# Patient Record
Sex: Male | Born: 1970 | Race: White | Hispanic: No | Marital: Married | Smoking: Never smoker
Health system: Southern US, Community
[De-identification: ages and names within clinical notes are randomized; demographics above are authoritative.]

## PROBLEM LIST (undated history)

## (undated) DIAGNOSIS — R7989 Other specified abnormal findings of blood chemistry: Secondary | ICD-10-CM

## (undated) DIAGNOSIS — J189 Pneumonia, unspecified organism: Secondary | ICD-10-CM

## (undated) DIAGNOSIS — R011 Cardiac murmur, unspecified: Secondary | ICD-10-CM

## (undated) DIAGNOSIS — E291 Testicular hypofunction: Secondary | ICD-10-CM

## (undated) DIAGNOSIS — Z9889 Other specified postprocedural states: Secondary | ICD-10-CM

## (undated) DIAGNOSIS — K4020 Bilateral inguinal hernia, without obstruction or gangrene, not specified as recurrent: Secondary | ICD-10-CM

## (undated) DIAGNOSIS — I350 Nonrheumatic aortic (valve) stenosis: Secondary | ICD-10-CM

## (undated) DIAGNOSIS — Z8619 Personal history of other infectious and parasitic diseases: Secondary | ICD-10-CM

## (undated) DIAGNOSIS — I10 Essential (primary) hypertension: Secondary | ICD-10-CM

## (undated) HISTORY — DX: Bilateral inguinal hernia, without obstruction or gangrene, not specified as recurrent: K40.20

## (undated) HISTORY — DX: Cardiac murmur, unspecified: R01.1

## (undated) HISTORY — DX: Testicular hypofunction: E29.1

## (undated) HISTORY — DX: Nonrheumatic aortic (valve) stenosis: I35.0

## (undated) HISTORY — PX: KNEE SURGERY: SHX244

## (undated) HISTORY — DX: Other specified abnormal findings of blood chemistry: R79.89

## (undated) HISTORY — DX: Personal history of other infectious and parasitic diseases: Z86.19

## (undated) HISTORY — DX: Essential (primary) hypertension: I10

---

## 2005-07-31 ENCOUNTER — Encounter: Admission: RE | Admit: 2005-07-31 | Discharge: 2005-07-31 | Payer: Self-pay | Admitting: Orthopedic Surgery

## 2011-06-20 ENCOUNTER — Ambulatory Visit: Payer: Self-pay | Admitting: Family Medicine

## 2011-06-20 ENCOUNTER — Ambulatory Visit (INDEPENDENT_AMBULATORY_CARE_PROVIDER_SITE_OTHER): Payer: BC Managed Care – PPO | Admitting: Family Medicine

## 2011-06-20 ENCOUNTER — Encounter: Payer: Self-pay | Admitting: Family Medicine

## 2011-06-20 VITALS — BP 122/70 | HR 72 | Temp 98.1°F | Ht 72.0 in | Wt 236.8 lb

## 2011-06-20 DIAGNOSIS — J189 Pneumonia, unspecified organism: Secondary | ICD-10-CM

## 2011-06-20 DIAGNOSIS — I10 Essential (primary) hypertension: Secondary | ICD-10-CM

## 2011-06-20 DIAGNOSIS — I359 Nonrheumatic aortic valve disorder, unspecified: Secondary | ICD-10-CM

## 2011-06-20 DIAGNOSIS — I350 Nonrheumatic aortic (valve) stenosis: Secondary | ICD-10-CM

## 2011-06-20 MED ORDER — LISINOPRIL 5 MG PO TABS: 5.0000 mg | ORAL_TABLET | Freq: Every day | ORAL | Status: DC

## 2011-06-20 NOTE — Patient Instructions (Signed)
I would use the inhaler as needed.  Finish the antibiotics and take the prednisone with food.  This should gradually get better.   We'll request your records.  Take care.  Call with questions.

## 2011-06-23 ENCOUNTER — Encounter: Payer: Self-pay | Admitting: Family Medicine

## 2011-06-23 DIAGNOSIS — I10 Essential (primary) hypertension: Secondary | ICD-10-CM | POA: Insufficient documentation

## 2011-06-23 DIAGNOSIS — I350 Nonrheumatic aortic (valve) stenosis: Secondary | ICD-10-CM | POA: Insufficient documentation

## 2011-06-23 DIAGNOSIS — J189 Pneumonia, unspecified organism: Secondary | ICD-10-CM | POA: Insufficient documentation

## 2011-06-23 NOTE — Assessment & Plan Note (Signed)
Controlled, continue as is.  Requesting records.  

## 2011-06-23 NOTE — Assessment & Plan Note (Signed)
Requesting records.   

## 2011-06-23 NOTE — Assessment & Plan Note (Signed)
Improving but not back to baseline.  Continue current meds.  Requesting records.

## 2011-06-23 NOTE — Progress Notes (Signed)
New pt. H/o AS, requesting records.  No CP, SOB (except with recent illness), BLE edema.  Prev echo done.  Known murmur.  H/o PNA recently. ~2 weeks of cough.  Fatigued, limited activity, sweats, cough, fever.  dx'd at [facility], started on abx/prednisone with CXR having infiltrate per report.  Some better now but not back to baseline.   H/o HTN, controlled, with current meds.  Pt is trying to work on weight and diet.  Discussed.   PMH and SH reviewed  ROS: See HPI, otherwise noncontributory.  Meds, vitals, and allergies reviewed.   nad ncat Mmm Rrr, murmur noted Ctab, cough noted, but no focal dec in BS No inc in wob abd soft, not ttp Ext w/o edema Ext well perfused.

## 2011-06-24 ENCOUNTER — Ambulatory Visit: Payer: Self-pay | Admitting: Family Medicine

## 2011-08-20 ENCOUNTER — Other Ambulatory Visit: Payer: Self-pay

## 2012-01-29 ENCOUNTER — Ambulatory Visit: Payer: BC Managed Care – PPO | Admitting: Family Medicine

## 2012-01-30 ENCOUNTER — Encounter: Payer: Self-pay | Admitting: Family Medicine

## 2012-01-30 ENCOUNTER — Ambulatory Visit (INDEPENDENT_AMBULATORY_CARE_PROVIDER_SITE_OTHER): Payer: BC Managed Care – PPO | Admitting: Family Medicine

## 2012-01-30 VITALS — BP 126/84 | HR 76 | Temp 98.9°F | Wt 245.0 lb

## 2012-01-30 DIAGNOSIS — I359 Nonrheumatic aortic valve disorder, unspecified: Secondary | ICD-10-CM

## 2012-01-30 DIAGNOSIS — I350 Nonrheumatic aortic (valve) stenosis: Secondary | ICD-10-CM

## 2012-01-30 DIAGNOSIS — I10 Essential (primary) hypertension: Secondary | ICD-10-CM

## 2012-01-30 DIAGNOSIS — Z23 Encounter for immunization: Secondary | ICD-10-CM

## 2012-01-30 NOTE — Patient Instructions (Addendum)
I would occasionally check your BP.  If consistently >130/90, then notify me.  See Shirlee Limerick about your referral before you leave today. Take care.  Glad to see you.

## 2012-01-30 NOTE — Progress Notes (Signed)
H/o aortic stenosis.  Due for echo, not done in last 4 years at least per patient.  See below.   H/o HTN and was off meds for last month or so.  Prev was on lisinopril 5mg .  No CP, SOB, BLE.  No presyncope or syncope.  Had a few/occ episodes of lightheadedness with BP meds prev, but not since then.    Had had tennis elbow symptoms and he's going to talk to Dr. Penni Bombard about this.    Meds, vitals, and allergies reviewed.   ROS: See HPI.  Otherwise, noncontributory.  nad ncat Mmm Neck supple, no LA Rrr, murmur noted ctab abd soft, not ttp Ext w/o edema R elbow with tennis elbow strap

## 2012-01-31 ENCOUNTER — Encounter: Payer: Self-pay | Admitting: Family Medicine

## 2012-01-31 NOTE — Assessment & Plan Note (Signed)
Resolved off meds as of 2013.  Will check echo to see LV structure.  D/w pt.

## 2012-01-31 NOTE — Assessment & Plan Note (Signed)
Recheck echo and notify pt at that point.  He agrees.

## 2012-02-10 ENCOUNTER — Telehealth: Payer: Self-pay | Admitting: Family Medicine

## 2012-02-10 NOTE — Telephone Encounter (Signed)
Patient called and asked [place] to cancel his Echo set up for 12/17 at [facility] in [place]. Will call back if he decides to reschedule just doesn't want to have at this time. Echo cancelled.

## 2012-02-11 NOTE — Telephone Encounter (Signed)
Noted  

## 2012-02-24 ENCOUNTER — Other Ambulatory Visit: Payer: BC Managed Care – PPO

## 2012-06-30 ENCOUNTER — Telehealth: Payer: Self-pay

## 2012-06-30 DIAGNOSIS — I10 Essential (primary) hypertension: Secondary | ICD-10-CM

## 2012-06-30 MED ORDER — LISINOPRIL 5 MG PO TABS: 5.0000 mg | ORAL_TABLET | Freq: Every day | ORAL | Status: DC

## 2012-06-30 NOTE — Telephone Encounter (Signed)
Pt has not taken Lisinopril since last seen 01/30/12; pt has not been cking BP until 06/28/12 BP was 150/90 and 06/29/12 BP was 140/89. Pt had h/a x 1 2 weeks ago (only time had h/a  since seen)No dizziness, CP or SOB. Pt request to ck with Dr Para March to see if needs to restart Lisinopril. Please advise. [facility] [place].

## 2012-06-30 NOTE — Telephone Encounter (Signed)
I would restart it and then have him get a BMET checked about 2 week after restart.  Have him check BP a few days before the BMET and drop the numbers off when he comes for the labs.

## 2012-07-01 NOTE — Telephone Encounter (Signed)
Patient says he has actually already started back on the medication because of his high readings.  Lab appt scheduled and patient advised to get some BP readings to bring in at his lab visit.  He says his BP has already gotten much better since restarting the med.

## 2012-07-19 ENCOUNTER — Other Ambulatory Visit: Payer: BC Managed Care – PPO

## 2012-08-05 ENCOUNTER — Other Ambulatory Visit: Payer: BC Managed Care – PPO

## 2012-08-12 ENCOUNTER — Ambulatory Visit (INDEPENDENT_AMBULATORY_CARE_PROVIDER_SITE_OTHER): Payer: Managed Care, Other (non HMO) | Admitting: Family Medicine

## 2012-08-12 ENCOUNTER — Encounter: Payer: Self-pay | Admitting: Family Medicine

## 2012-08-12 VITALS — BP 100/70 | HR 54 | Temp 98.3°F | Wt 236.8 lb

## 2012-08-12 DIAGNOSIS — M722 Plantar fascial fibromatosis: Secondary | ICD-10-CM

## 2012-08-12 DIAGNOSIS — I10 Essential (primary) hypertension: Secondary | ICD-10-CM

## 2012-08-12 NOTE — Patient Instructions (Addendum)
Get a pair of soft arch support replacement inserts.  Stretch your arch before your get out of bed or up from a chair (before bearing any weight). Go to the lab on the way out.  We'll contact you with your lab report.  Take care. Glad to see you.

## 2012-08-12 NOTE — Progress Notes (Signed)
He's was putting in for a part time job and that turned into a full time offer.  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  He's getting out of his former company.    R heel pain.  He's been doing stretches and still has pain.  On the origin of the plantar fascia.  Pain with standing, as soon as he gets out of bed. Better as the day goes on.    Back on lisinopril.  Not dizzy on standing.  No CP.  Not SOB.  Weight is down with improved diet and meal planning/schedule.    Meds, vitals, and allergies reviewed.   ROS: See HPI.  Otherwise, noncontributory.  nad ncat rrr with murmur noted R foot with normal inspection except for mild loss of arch.   ttp at the origin of the plantar fascia.   Distally nv intact

## 2012-08-12 NOTE — Assessment & Plan Note (Signed)
No need to xray today.  D/w pt about stretching before any weight bearing and inc in arch support with inserts.  He agrees. Call back if not improved.

## 2012-08-12 NOTE — Assessment & Plan Note (Signed)
BP okay, check BMET today. Continue ACE.

## 2012-08-13 LAB — BASIC METABOLIC PANEL: GFR: 81.69 mL/min (ref >60.00)

## 2012-08-16 ENCOUNTER — Encounter: Payer: Self-pay | Admitting: *Deleted

## 2012-09-13 ENCOUNTER — Encounter: Payer: Self-pay | Admitting: Family Medicine

## 2012-09-13 ENCOUNTER — Ambulatory Visit (INDEPENDENT_AMBULATORY_CARE_PROVIDER_SITE_OTHER): Payer: Managed Care, Other (non HMO) | Admitting: Family Medicine

## 2012-09-13 VITALS — BP 116/80 | HR 53 | Temp 98.1°F | Wt 239.8 lb

## 2012-09-13 DIAGNOSIS — L237 Allergic contact dermatitis due to plants, except food: Secondary | ICD-10-CM | POA: Insufficient documentation

## 2012-09-13 DIAGNOSIS — L255 Unspecified contact dermatitis due to plants, except food: Secondary | ICD-10-CM

## 2012-09-13 DIAGNOSIS — M722 Plantar fascial fibromatosis: Secondary | ICD-10-CM

## 2012-09-13 MED ORDER — PREDNISONE 20 MG PO TABS: ORAL_TABLET | ORAL | Status: DC

## 2012-09-13 NOTE — Assessment & Plan Note (Signed)
Or similar trigger.  Would use oral pred taper with GI/steroid caution and f/u prn.  Can use claritin 10mg  a day.  Fu prn.  Doesn't appear infected.

## 2012-09-13 NOTE — Progress Notes (Signed)
Poison ivy/oak exposure about 10 days ago.  Small spots initially, spreading. Itchy.  L arm and B thighs.  No FCNAVD.  He had some tick bites but these were not engorged and likely unrelated.    His R foot got some better in a flexion brace but he had some numbness in the toes after started wearing the it.  He has new inserts and that helped some with plantar fasciitis pain.   Meds, vitals, and allergies reviewed.   ROS: See HPI.  Otherwise, noncontributory.  GEN: nad, alert and oriented SKIN: blanching rash on L arm with irregular pattern.

## 2012-09-13 NOTE — Assessment & Plan Note (Signed)
He'll use the brace, less compression as this is likely causing peripheral impingement.  If not improved with that and steroid course, he'll check with podiatry.

## 2012-09-13 NOTE — Patient Instructions (Addendum)
Take the prednisone with food and take claritin 10mg  a day (loratadine).   If the foot doesn't improve, then you'll need to see podiatry.

## 2013-04-01 ENCOUNTER — Ambulatory Visit (INDEPENDENT_AMBULATORY_CARE_PROVIDER_SITE_OTHER): Payer: Managed Care, Other (non HMO)

## 2013-04-01 VITALS — BP 119/85 | HR 51 | Resp 18

## 2013-04-01 DIAGNOSIS — M7750 Other enthesopathy of unspecified foot: Secondary | ICD-10-CM

## 2013-04-01 DIAGNOSIS — M1990 Unspecified osteoarthritis, unspecified site: Secondary | ICD-10-CM

## 2013-04-01 DIAGNOSIS — M79609 Pain in unspecified limb: Secondary | ICD-10-CM

## 2013-04-01 DIAGNOSIS — M779 Enthesopathy, unspecified: Secondary | ICD-10-CM

## 2013-04-01 DIAGNOSIS — M778 Other enthesopathies, not elsewhere classified: Secondary | ICD-10-CM

## 2013-04-01 DIAGNOSIS — M722 Plantar fascial fibromatosis: Secondary | ICD-10-CM

## 2013-04-01 MED ORDER — MELOXICAM 15 MG PO TABS: 15.0000 mg | ORAL_TABLET | Freq: Every day | ORAL | Status: DC

## 2013-04-01 NOTE — Patient Instructions (Signed)

## 2013-04-01 NOTE — Progress Notes (Signed)
° °  Subjective:    Patient ID: Johnny Henderson, male    DOB: 11-Aug-1970, 43 y.o.   MRN: [id number]  HPI hurts on top of my right foot and has been going on for about 4 months and hurts all the time and no swelling and I have a bone spur on the right foot and went to urgent care and got some medicine and that was 2 months ago and did do an injection    Review of Systems  Constitutional: Negative.   HENT: Negative.   Eyes: Negative.   Respiratory: Negative.   Cardiovascular: Negative.   Gastrointestinal: Negative.   Endocrine: Negative.   Genitourinary: Negative.   Musculoskeletal: Negative.   Skin: Negative.   Allergic/Immunologic: Negative.   Neurological: Negative.   Hematological: Negative.   Psychiatric/Behavioral: Negative.        Objective:   Physical Exam Vascular status is intact with pedal pulses palpable DP and PT +2/4 bilateral Refill time 3 seconds all digits. Skin temperature warm turgor normal neurologically epicritic and proprioceptive sensations intact and symmetric bilateral. Orthopedic exam there is pain on palpation mid band plantar fascia right foot. There is also tenderness on inversion eversion Lisfranc for fifth metatarsal base and cuboid articulation consistent with capsulitis and osteoarthropathy. X-rays confirm thickening of fascial structures well-developed inferior calcaneal spur long first metatarsal degenerative changes and Lisfranc joint. Pain on first up or getting a fair following activities or sitting for a period of rest is been going on for several months and no relief.      Assessment & Plan:  Assessment plantar fasciitis/heel spur syndrome right foot with capsulitis second and arthropathy secondary to promontory change of the foot plan at this time fascial strapping applied to the right foot prescription for Mobic 15 mg once daily with 30 day supply with 2 refills. Reappointed in 2 weeks for followup may be candidate for functional orthoses based on  progress. Recommend ice to the heel as well the interim reappointed 2 weeks for followup Alvan Dameichard Sikora DPM

## 2013-04-15 ENCOUNTER — Ambulatory Visit (INDEPENDENT_AMBULATORY_CARE_PROVIDER_SITE_OTHER): Payer: Managed Care, Other (non HMO)

## 2013-04-15 VITALS — BP 126/73 | HR 55 | Resp 16

## 2013-04-15 DIAGNOSIS — M722 Plantar fascial fibromatosis: Secondary | ICD-10-CM

## 2013-04-15 DIAGNOSIS — M778 Other enthesopathies, not elsewhere classified: Secondary | ICD-10-CM

## 2013-04-15 DIAGNOSIS — M79609 Pain in unspecified limb: Secondary | ICD-10-CM

## 2013-04-15 DIAGNOSIS — M7750 Other enthesopathy of unspecified foot: Secondary | ICD-10-CM

## 2013-04-15 DIAGNOSIS — M779 Enthesopathy, unspecified: Secondary | ICD-10-CM

## 2013-04-15 NOTE — Patient Instructions (Signed)

## 2013-04-15 NOTE — Progress Notes (Signed)
   Subjective:    Patient ID: Johnny Henderson, male    DOB: 11/16/1970, 43 y.o.   MRN: [id number]  HPI Comments: "Well, the meloxicam really helped, I can tell because I haven't been taking it for 3 days now and its really sore. The taping really made my foot feel good"     Review of Systems no new changes or findings     Objective:   Physical Exam Vascular status is intact with pedal pulses palpable epicritic and proprioceptive sensations intact the right foot has significant through both meloxicam as well as the fascial strapping had significant improvement. Based on those parameters patient is a candidate for functional orthoses.       Assessment & Plan:  Assessment plantar fasciitis/heel spur syndrome as well as Lisfranc's capsulitis right foot. At this time orthotics skin is carried out and fascial strapping is in reapplied maintain for another 5 days. Within 3 or 4 weeks without should be ready for fitting and dispensing within for followup use of orthoses at that time. Contacted in changes or exacerbations occur in the interim. Next  Alvan Dameichard Bronco Mcgrory DPM

## 2013-04-23 ENCOUNTER — Other Ambulatory Visit: Payer: Self-pay | Admitting: Family Medicine

## 2013-11-02 ENCOUNTER — Ambulatory Visit: Payer: Managed Care, Other (non HMO)

## 2014-02-06 DIAGNOSIS — R7989 Other specified abnormal findings of blood chemistry: Secondary | ICD-10-CM | POA: Insufficient documentation

## 2014-08-23 DIAGNOSIS — E291 Testicular hypofunction: Secondary | ICD-10-CM | POA: Insufficient documentation

## 2017-04-17 DIAGNOSIS — S92309A Fracture of unspecified metatarsal bone(s), unspecified foot, initial encounter for closed fracture: Secondary | ICD-10-CM | POA: Insufficient documentation

## 2018-11-30 DIAGNOSIS — K4020 Bilateral inguinal hernia, without obstruction or gangrene, not specified as recurrent: Secondary | ICD-10-CM | POA: Insufficient documentation

## 2019-12-12 DIAGNOSIS — S83209A Unspecified tear of unspecified meniscus, current injury, unspecified knee, initial encounter: Secondary | ICD-10-CM | POA: Insufficient documentation

## 2021-04-11 ENCOUNTER — Telehealth: Payer: Self-pay

## 2021-04-11 NOTE — Telephone Encounter (Signed)
NOTES SCANNED TO REFERRAL 

## 2021-05-09 NOTE — Progress Notes (Signed)
? ?Chief Complaint  ?Patient presents with  ? New Patient (Initial Visit)  ?  Aortic stenosis  ? ?History of Present Illness: 51 yo male with history of HTN, dilated ascending aorta and bicuspid aortic valve with moderate aortic stenosis who is here today as a new patient for the evaluation of aortic stenosis. Echo January 2023 at [facility] center with LVEF=55-60%. The aortic valve is noted to be bicuspid. Moderate aortic stenosis with mean gradient 28 mmHg. Dilated ascending aorta. He has been told that he has had a murmur since he was a child. He has been feeling well. He has no chest pain, dyspnea, dizziness, near syncope or LE edema. He is very active.  ? ?Primary Care Physician: Shellia Cleverly, PA ? ?Past Medical History:  ?Diagnosis Date  ? Aortic stenosis   ? Heart murmur   ? History of chicken pox   ? Hypertension   ? Hypogonadism male   ? Low serum testosterone   ? Non-recurrent bilateral inguinal hernia   ? ?Past Surgical History:  ?Procedure Laterality Date  ? KNEE SURGERY    ? ? ?Current Outpatient Medications  ?Medication Sig Dispense Refill  ? buPROPion (WELLBUTRIN XL) 150 MG 24 hr tablet Take 150 mg by mouth daily.    ? EPINEPHrine 0.3 mg/0.3 mL IJ SOAJ injection Inject 0.3 mg into the muscle as needed for anaphylaxis.    ? Testosterone 12.5 MG/ACT (1%) GEL Apply 2 Pump topically daily.    ? ?No current facility-administered medications for this visit.  ? ? ?No Known Allergies ? ?Social History  ? ?Socioeconomic History  ? Marital status: Married  ?  Spouse name: Not on file  ? Number of children: 3  ? Years of education: Not on file  ? Highest education level: Not on file  ?Occupational History  ? Occupation: Psychologist, sport and exercise  ?Tobacco Use  ? Smoking status: Never  ? Smokeless tobacco: Never  ?Substance and Sexual Activity  ? Alcohol use: No  ? Drug use: No  ? Sexual activity: Not on file  ?Other Topics Concern  ? Not on file  ?Social History Narrative  ? Some college, Chiropodist  ?  Married   ? 3 kids  ? ?Social Determinants of Health  ? ?Financial Resource Strain: Not on file  ?Food Insecurity: Not on file  ?Transportation Needs: Not on file  ?Physical Activity: Not on file  ?Stress: Not on file  ?Social Connections: Not on file  ?Intimate Partner Violence: Not on file  ? ? ?Family History  ?Problem Relation Age of Onset  ? Arthritis Other   ? Cancer Other   ?     Breast  ? Hyperlipidemia Other   ? Heart disease Other   ? ? ?Review of Systems:  As stated in the HPI and otherwise negative.  ? ?BP 134/80   Ht 6' (1.829 m)   Wt 269 lb (122 kg)   SpO2 95%   BMI 36.48 kg/m?  ? ?Physical Examination: ?General: Well developed, well nourished, NAD  ?HEENT: OP clear, mucus membranes moist  ?SKIN: warm, dry. No rashes. ?Neuro: No focal deficits  ?Musculoskeletal: Muscle strength 5/5 all ext  ?Psychiatric: Mood and affect normal  ?Neck: No JVD, no carotid bruits, no thyromegaly, no lymphadenopathy.  ?Lungs:Clear bilaterally, no wheezes, rhonci, crackles ?Cardiovascular: Regular rate and rhythm. Systolic murmur  ?Abdomen:Soft. Bowel sounds present. Non-tender.  ?Extremities: No lower extremity edema. Pulses are 2 + in the bilateral DP/PT. ? ?EKG:  EKG is ordered today. ?The ekg ordered today demonstrates Sinus, PVC ? ?Recent Labs: ?No results found for requested labs within last 8760 hours.  ? ?Lipid Panel ?No results found for: CHOL, TRIG, HDL, CHOLHDL, VLDL, LDLCALC, LDLDIRECT ?  ?Wt Readings from Last 3 Encounters:  ?05/10/21 269 lb (122 kg)  ?09/13/12 239 lb 12 oz (108.7 kg)  ?08/12/12 236 lb 12 oz (107.4 kg)  ?  ? ?Assessment and Plan:  ? ?1. Bicuspid aortic valve stenosis: Moderate aortic stenosis with likely bicuspid aortic valve. Mean gradient 28 mmHg. He has no symptoms to suggest his stenosis is significant. Will repeat echo in one year.   ? ?2. Dilated thoracic aorta: Seen on echo from outside facility. Will arrange chest CTA to assess his aorta. BMET today.  ? ?Labs/ tests ordered today  include:  ? ?Orders Placed This Encounter  ?Procedures  ? CT ANGIO CHEST AORTA W/CM & OR WO/CM  ? Basic metabolic panel  ? EKG 12-Lead  ? ? ? ?Disposition:   F/U with me in one year.  ? ? ?Signed, ?Verne Carrow, MD ?05/10/2021 11:44 AM    ?[facility] ?[address] [place], [place], [place]  [address] ?Phone: [phone]; Fax: [phone]  ? ? ?

## 2021-05-10 ENCOUNTER — Ambulatory Visit: Payer: Managed Care, Other (non HMO) | Admitting: Cardiovascular Disease

## 2021-05-10 ENCOUNTER — Encounter: Payer: Self-pay | Admitting: Cardiovascular Disease

## 2021-05-10 ENCOUNTER — Other Ambulatory Visit: Payer: Self-pay

## 2021-05-10 VITALS — BP 134/80 | Ht 72.0 in | Wt 269.0 lb

## 2021-05-10 DIAGNOSIS — I7121 Aneurysm of the ascending aorta, without rupture: Secondary | ICD-10-CM

## 2021-05-10 LAB — BASIC METABOLIC PANEL
BUN/Creatinine Ratio: 12 (ref 9–20)
BUN: 14 mg/dL (ref 6–24)
CO2: 25 mmol/L (ref 20–29)
Calcium: 9.6 mg/dL (ref 8.7–10.2)
Chloride: 96 mmol/L (ref 96–106)
Creatinine, Ser: 1.17 mg/dL (ref 0.76–1.27)
Glucose: 86 mg/dL (ref 70–99)
Potassium: 4.6 mmol/L (ref 3.5–5.2)
Sodium: 137 mmol/L (ref 134–144)
eGFR: 76 mL/min/{1.73_m2} (ref >59)

## 2021-05-10 NOTE — Patient Instructions (Signed)
Medication Instructions:  ?No changes ?*If you need a refill on your cardiac medications before your next appointment, please call your pharmacy* ? ? ?Lab Work: ?Today: BMET ?If you have labs (blood work) drawn today and your tests are completely normal, you will receive your results only by: ?MyChart Message (if you have MyChart) OR ?A paper copy in the mail ?If you have any lab test that is abnormal or we need to change your treatment, we will call you to review the results. ? ? ?Testing/Procedures: ?Chest CTA - Aorta ? ? ?Follow-Up: ?At [facility], you and your health needs are our priority.  As part of our continuing mission to provide you with exceptional heart care, we have created designated Provider Care Teams.  These Care Teams include your primary Cardiologist (physician) and Advanced Practice Providers (APPs -  Physician Assistants and Nurse Practitioners) who all work together to provide you with the care you need, when you need it. ? ?We recommend signing up for the patient portal called "MyChart".  Sign up information is provided on this After Visit Summary.  MyChart is used to connect with patients for Virtual Visits (Telemedicine).  Patients are able to view lab/test results, encounter notes, upcoming appointments, etc.  Non-urgent messages can be sent to your provider as well.   ?To learn more about what you can do with MyChart, go to [email or web address].   ? ?Your next appointment:   ?12 month(s) ? ?The format for your next appointment:   ?In Person ? ?Provider:   ?None   ? ?  ?

## 2021-05-17 ENCOUNTER — Other Ambulatory Visit: Payer: Self-pay

## 2021-05-17 ENCOUNTER — Ambulatory Visit (INDEPENDENT_AMBULATORY_CARE_PROVIDER_SITE_OTHER)
Admission: RE | Admit: 2021-05-17 | Discharge: 2021-05-17 | Disposition: A | Discharge status: Home / Self Care | Payer: Managed Care, Other (non HMO) | Source: Ambulatory Visit | Attending: Cardiovascular Disease | Admitting: Cardiovascular Disease

## 2021-05-17 DIAGNOSIS — I7121 Aneurysm of the ascending aorta, without rupture: Secondary | ICD-10-CM | POA: Diagnosis not present

## 2021-05-17 MED ORDER — IOHEXOL 350 MG/ML SOLN
100.0000 mL | Freq: Once | INTRAVENOUS | Status: AC | PRN
Start: 2021-05-17 — End: 2021-05-17
  Administered 2021-05-17: 100 mL via INTRAVENOUS

## 2021-05-17 MED ORDER — IOHEXOL 350 MG/ML SOLN: 100.0000 mL | Freq: Once | INTRAVENOUS | Status: DC | PRN

## 2021-05-21 ENCOUNTER — Telehealth: Payer: Self-pay

## 2021-05-21 DIAGNOSIS — I7121 Aneurysm of the ascending aorta, without rupture: Secondary | ICD-10-CM

## 2021-05-21 NOTE — Telephone Encounter (Signed)
-----   Message from Kathleene Hazel, MD sent at 05/20/2021  8:19 AM EDT ----- ?His ascending aorta is dilated at 5.2 cm. This is not in a range yet that will need surgery but he will need to be referred to CT surgery for evaluation and follow up of his dilated aorta. Can we let him know and make a referral to CT surgery to see one of the surgeons? Thanks, Thayer Ohm ?

## 2021-06-07 ENCOUNTER — Encounter: Payer: Managed Care, Other (non HMO) | Admitting: Thoracic Surgery (Cardiothoracic Vascular Surgery)

## 2021-06-10 ENCOUNTER — Ambulatory Visit: Payer: Managed Care, Other (non HMO) | Admitting: Cardiology

## 2021-06-10 ENCOUNTER — Ambulatory Visit: Payer: Managed Care, Other (non HMO) | Admitting: Cardiovascular Disease

## 2021-06-26 NOTE — Progress Notes (Signed)
? ?   ?[address] ?      [place] [address] ?            [phone]       ? ?Josefa Half ?[facility] Medical Record #[id number] ?Date of Birth: September 30, 1970 ? ?Referring: Kathleene Hazel* ?Primary Care: Shellia Cleverly, PA ?Primary Cardiologist:None ? ?Chief Complaint:    ?Chief Complaint  ?Patient presents with  ? Thoracic Aortic Aneurysm  ?  Initial surgical consult, CTA chest 3/10, ECHO 1/24  ? ? ?History of Present Illness:     ?51 year old male referred for surgical evaluation of an ascending years.  He also has a history of a bicuspid aortic valve and was noted to have moderate stenosis on his most recent echo.  Functionally he denies any chest pain or shortness of breath.  Continues to coach middle school soccer, and works fairly strenuous job from a physical standpoint.  He also cycles regularly. ? ? ?Past Medical and Surgical History: ?Previous Chest Surgery: No ?Previous Chest Radiation: No ?Diabetes Mellitus: No.  HbA1C pending ?Creatinine: 1.17 ? ?Past Medical History:  ?Diagnosis Date  ? Aortic stenosis   ? Heart murmur   ? History of chicken pox   ? Hypertension   ? Hypogonadism male   ? Low serum testosterone   ? Non-recurrent bilateral inguinal hernia   ? ? ?Past Surgical History:  ?Procedure Laterality Date  ? KNEE SURGERY    ? ? ?Social History: ?Support: Lives with his wife and children ? ?Social History  ? ?Tobacco Use  ?Smoking Status Never  ?Smokeless Tobacco Never  ?  ?Social History  ? ?Substance and Sexual Activity  ?Alcohol Use No  ? ? ? ?Allergies  ?Allergen Reactions  ? Bee Venom Anaphylaxis  ? Ibuprofen Nausea And Vomiting and Other (See Comments)  ?  GI Upset ?GI Upset ?  ? ? ? ? ?Current Outpatient Medications  ?Medication Sig Dispense Refill  ? EPINEPHrine 0.3 mg/0.3 mL IJ SOAJ injection Inject 0.3 mg into the muscle as needed for anaphylaxis.    ? Testosterone 12.5 MG/ACT (1%) GEL Apply 2 Pump topically daily.    ? ?No current facility-administered  medications for this visit.  ? ? ?(Not in a hospital admission) ? ? ?Family History  ?Problem Relation Age of Onset  ? Arthritis Other   ? Cancer Other   ?     Breast  ? Hyperlipidemia Other   ? Heart disease Other   ? ? ? ?Review of Systems:  ? ?Review of Systems  ?Constitutional:  Negative for malaise/fatigue.  ?Respiratory:  Negative for cough and shortness of breath.   ?Cardiovascular: Negative.   ?Musculoskeletal:  Positive for joint pain.  ?Neurological: Negative.   ?  ? ?Physical Exam: ?BP (!) 147/95 (BP Location: Right Arm, Patient Position: Sitting)   Pulse (!) 57   Resp 20   Ht 6' (1.829 m)   Wt 263 lb (119.3 kg)   SpO2 96% Comment: RA  BMI 35.67 kg/m?  ?Physical Exam ?Constitutional:   ?   General: He is not in acute distress. ?   Appearance: Normal appearance. He is obese. He is not ill-appearing.  ?HENT:  ?   Head: Normocephalic and atraumatic.  ?Eyes:  ?   Extraocular Movements: Extraocular movements intact.  ?Cardiovascular:  ?   Rate and Rhythm: Normal rate and regular rhythm.  ?   Heart sounds: Murmur heard.  ?Pulmonary:  ?   Effort: Pulmonary  effort is normal. No respiratory distress.  ?Abdominal:  ?   General: There is no distension.  ?Musculoskeletal:     ?   General: Normal range of motion.  ?Skin: ?   General: Skin is warm and dry.  ?Neurological:  ?   General: No focal deficit present.  ?   Mental Status: He is alert and oriented to person, place, and time.  ?  ? ? ?Diagnostic Studies & Laboratory data: ?   ?Left Heart Catherization: ?Pending ?Echo: ?Echo January 2023 at [facility] center with LVEF=55-60%. The aortic valve is noted to be bicuspid. Moderate aortic stenosis with mean gradient 28 mmHg. ?EKG: ?Sinus ?I have independently reviewed the above radiologic studies and discussed with the patient  ? ?Recent Lab Findings: ?Lab Results  ?Component Value Date  ? GLUCOSE 86 05/10/2021  ? NA 137 05/10/2021  ? K 4.6 05/10/2021  ? CL 96 05/10/2021  ? CREATININE 1.17 05/10/2021  ?  BUN 14 05/10/2021  ? CO2 25 05/10/2021  ? ?Cardiovascular: The aortic root measures approximately 4.0-4.2 cm at ?the level of the sinuses of Valsalva. The aortic valve is thickened ?and calcified. The ascending thoracic aorta is difficult to ?accurately measure due to pulsation artifact but is clearly ?significantly dilated with maximal diameter estimated to be ?approximately 5.2 cm. The proximal arch measures 4 cm and the distal ?arch 2.5 cm. The descending thoracic aorta measures 2.6 cm. ?Visualized proximal great vessels demonstrate normal patency and ?normal branching anatomy. No evidence of aortic dissection. ? ? ? ?Assessment / Plan:   ?51 year old male with bicuspid aortic valve with moderate aortic stenosis and a sending aortic aneurysm personally reviewed his CT scan and his aortic aneurysm measures 5.2 cm.  The root is dilated to only 4 cm and the arch is 4 cm as well.  He will need a left heart cath and dental clearance.  We will plan for a right axillary artery cannulation, mechanical aortic valve replacement, and a sending aortic replacement with hemiarch.  He would like to wait until June 1.  Given him weight restrictions and warning signs and symptoms for acute dissection.  I have also instructed him to check his blood pressure daily for the next week. ? ? ? ?I  spent 55 minutes counseling the patient face to face. ? ? ?Eliezer Lofts Dmari Schubring ?06/28/2021 12:12 PM ? ? ? ? ? ? ?

## 2021-06-28 ENCOUNTER — Institutional Professional Consult (permissible substitution): Payer: Managed Care, Other (non HMO) | Admitting: Thoracic Surgery (Cardiothoracic Vascular Surgery)

## 2021-06-28 ENCOUNTER — Other Ambulatory Visit: Payer: Self-pay | Admitting: *Deleted

## 2021-06-28 ENCOUNTER — Telehealth: Payer: Self-pay | Admitting: *Deleted

## 2021-06-28 ENCOUNTER — Encounter: Payer: Self-pay | Admitting: *Deleted

## 2021-06-28 VITALS — BP 147/95 | HR 57 | Resp 20 | Ht 72.0 in | Wt 263.0 lb

## 2021-06-28 DIAGNOSIS — I712 Thoracic aortic aneurysm, without rupture, unspecified: Secondary | ICD-10-CM

## 2021-06-28 DIAGNOSIS — I350 Nonrheumatic aortic (valve) stenosis: Secondary | ICD-10-CM

## 2021-06-28 NOTE — Telephone Encounter (Signed)
Message from Dr. Alfonse Alpers nurse that pt will be needing left heart catheterization as part of surgery planning.  Surgery scheduled for 08/08/21. ?

## 2021-07-01 ENCOUNTER — Other Ambulatory Visit: Payer: Self-pay | Admitting: *Deleted

## 2021-07-02 NOTE — Telephone Encounter (Signed)
Message from TCTS that patient called and plans to seek a second opinion with Dr. Laneta Simmers before proceeding.  We will wait until that appointment on 07/15/21 for an update before scheduling a heart catheterization. ?

## 2021-07-11 ENCOUNTER — Encounter (HOSPITAL_COMMUNITY): Payer: Self-pay | Admitting: Emergency Medicine

## 2021-07-11 ENCOUNTER — Emergency Department (HOSPITAL_COMMUNITY)
Admission: EM | Admit: 2021-07-11 | Discharge: 2021-07-11 | Disposition: A | Discharge status: Home / Self Care | Payer: Managed Care, Other (non HMO) | Attending: Emergency Medicine | Admitting: Emergency Medicine

## 2021-07-11 ENCOUNTER — Emergency Department (HOSPITAL_COMMUNITY): Payer: Managed Care, Other (non HMO)

## 2021-07-11 DIAGNOSIS — R1010 Upper abdominal pain, unspecified: Secondary | ICD-10-CM | POA: Insufficient documentation

## 2021-07-11 DIAGNOSIS — M546 Pain in thoracic spine: Secondary | ICD-10-CM | POA: Insufficient documentation

## 2021-07-11 DIAGNOSIS — R079 Chest pain, unspecified: Secondary | ICD-10-CM

## 2021-07-11 DIAGNOSIS — R0789 Other chest pain: Secondary | ICD-10-CM | POA: Diagnosis present

## 2021-07-11 DIAGNOSIS — I350 Nonrheumatic aortic (valve) stenosis: Secondary | ICD-10-CM | POA: Diagnosis not present

## 2021-07-11 LAB — BASIC METABOLIC PANEL
Anion gap: 9 (ref 5–15)
BUN: 16 mg/dL (ref 6–20)
CO2: 25 mmol/L (ref 22–32)
Calcium: 9.1 mg/dL (ref 8.9–10.3)
Chloride: 103 mmol/L (ref 98–111)
Creatinine, Ser: 1.19 mg/dL (ref 0.61–1.24)
GFR, Estimated: >60 mL/min (ref >60)
Glucose, Bld: 113 mg/dL — ABNORMAL HIGH (ref 70–99)
Potassium: 4 mmol/L (ref 3.5–5.1)
Sodium: 137 mmol/L (ref 135–145)

## 2021-07-11 LAB — CBC
HCT: 48 % (ref 39.0–52.0)
Hemoglobin: 16.6 g/dL (ref 13.0–17.0)
MCH: 33.8 pg (ref 26.0–34.0)
MCHC: 34.6 g/dL (ref 30.0–36.0)
MCV: 97.8 fL (ref 80.0–100.0)
Platelets: 257 10*3/uL (ref 150–400)
RBC: 4.91 MIL/uL (ref 4.22–5.81)
RDW: 12.5 % (ref 11.5–15.5)
WBC: 15.4 10*3/uL — ABNORMAL HIGH (ref 4.0–10.5)
nRBC: 0 % (ref 0.0–0.2)

## 2021-07-11 LAB — TROPONIN I (HIGH SENSITIVITY)
Troponin I (High Sensitivity): 5 ng/L (ref <18)
Troponin I (High Sensitivity): 4 ng/L (ref <18)

## 2021-07-11 MED ORDER — METOPROLOL TARTRATE 25 MG PO TABS: 12.5000 mg | ORAL_TABLET | Freq: Two times a day (BID) | ORAL | 0 refills | Status: DC

## 2021-07-11 MED ORDER — METOPROLOL TARTRATE 25 MG PO TABS
12.5000 mg | ORAL_TABLET | Freq: Once | ORAL | Status: AC
  Administered 2021-07-11: 12.5 mg via ORAL
  Filled 2021-07-11: qty 1

## 2021-07-11 MED ORDER — IOHEXOL 350 MG/ML SOLN
100.0000 mL | Freq: Once | INTRAVENOUS | Status: AC | PRN
  Administered 2021-07-11: 100 mL via INTRAVENOUS

## 2021-07-11 NOTE — ED Provider Triage Note (Signed)
Emergency Medicine Provider Triage Evaluation Note ? ?Johnny Henderson , a 51 y.o. male  was evaluated in triage.  Pt complains of chest pain.  Pain began last night, radiating between his shoulder blades, progressively worsening.  He has a known history of aortic stenosis and needs a an aortic valve repair as well as a ascending thoracic aortic aneurysm with previously evaluated that will be repaired as well.  Patient was concerned he might be having a dissection, he consulted with his cardiologist who told him to call 911 but he drove himself here.  He denies nausea, vomiting, shortness of breath.  He has associated pain in the back of his neck and headache. ? ?Review of Systems  ?Positive: Chest pain ?Negative: Fever ? ?Physical Exam  ?BP (!) 173/98 (BP Location: Right Arm)   Pulse 75   Temp 99.7 ?F (37.6 ?C) (Oral)   Resp 16   SpO2 98%  ?Gen:   Awake, no distress   ?Resp:  Normal effort  ?MSK:   Moves extremities without difficulty  ?Other:  Murmur present ? ?Medical Decision Making  ?Medically screening exam initiated at 2:00 PM.  Appropriate orders placed.  Johnny Henderson was informed that the remainder of the evaluation will be completed by another provider, this initial triage assessment does not replace that evaluation, and the importance of remaining in the ED until their evaluation is complete. ? ?Work-up initiated ?  ?Margarita Mail, PA-C ?07/11/21 1403 ? ?

## 2021-07-11 NOTE — ED Triage Notes (Signed)
Patient with history of aortic aneurysm with planned repair in the next few weeks complains of back pain that started to radiate into left neck this morning. Patient complains of "not feeling well". Patient alert, oriented, and in no apparent distressa at this time. ?

## 2021-07-11 NOTE — ED Provider Notes (Signed)
?[facility] EMERGENCY DEPARTMENT ?Provider Note ? ? ?CSN: [id number] ?Arrival date & time: 07/11/21  1342 ? ?  ? ?History ? ?Chief Complaint  ?Patient presents with  ? Chest Pain  ? ? ?Johnny Henderson is a 51 y.o. male. ? ?HPI ?51 year old male with a history of aortic stenosis and ascending aortic aneurysm presents with chest and back pain.  Last night he noticed some chest discomfort that was like a tightness and he was not feeling too well so he went to bed early.  When he woke up he had the chest discomfort again but did not feel bad him not to go to work.  At work he started noticing some right-sided back pain under his scapula but now is pretty much midline in his thoracic back.  He rates it as mild to moderate.  He has not taken anything for it.  Chest discomfort seems to be getting better.  No shortness of breath.  He is getting on and off neck discomfort on the left side that comes and goes.  No exertional symptoms.  No numbness or weakness.  The pain seems to radiate down from his back towards his upper abdomen bilaterally.  Called the cardiology office and was told to call 911 but drove himself here.  He is due to have aortic valve and aortic arch replacement in June. ? ?Home Medications ?Prior to Admission medications   ?Medication Sig Start Date End Date Taking? Authorizing Provider  ?EPINEPHrine 0.3 mg/0.3 mL IJ SOAJ injection Inject 0.3 mg into the muscle as needed for anaphylaxis.   Yes [provider]  ?amoxicillin (AMOXIL) 500 MG capsule SMARTSIG:4 Capsule(s) By Mouth 07/01/21   [provider]  ?Testosterone 12.5 MG/ACT (1%) GEL Apply 2 Pump topically daily. 04/23/21   [provider]  ?   ? ?Allergies    ?Bee venom, Other, and Ibuprofen   ? ?Review of Systems   ?Review of Systems  ?Constitutional:  Negative for diaphoresis.  ?Respiratory:  Negative for shortness of breath.   ?Cardiovascular:  Positive for chest pain.  ?Gastrointestinal:  Positive for  abdominal pain.  ?Musculoskeletal:  Positive for back pain.  ?Neurological:  Negative for weakness, numbness and headaches.  ? ?Physical Exam ?Updated Vital Signs ?BP (!) 143/98   Pulse (!) 57   Temp 98.5 ?F (36.9 ?C) (Oral)   Resp 17   SpO2 95%  ?Physical Exam ?Vitals and nursing note reviewed.  ?Constitutional:   ?   General: He is not in acute distress. ?   Appearance: He is well-developed. He is obese. He is not ill-appearing or diaphoretic.  ?HENT:  ?   Head: Normocephalic and atraumatic.  ?Cardiovascular:  ?   Rate and Rhythm: Normal rate and regular rhythm.  ?   Pulses:     ?     Radial pulses are 2+ on the right side and 2+ on the left side.  ?   Heart sounds: Murmur heard.  ?Pulmonary:  ?   Effort: Pulmonary effort is normal.  ?   Breath sounds: Normal breath sounds.  ?Abdominal:  ?   Palpations: Abdomen is soft.  ?   Tenderness: There is no abdominal tenderness.  ?Musculoskeletal:  ?   Right lower leg: No edema.  ?   Left lower leg: No edema.  ?Skin: ?   General: Skin is warm and dry.  ?Neurological:  ?   Mental Status: He is alert.  ? ? ?ED Results / Procedures /  Treatments   ?Labs ?(all labs ordered are listed, but only abnormal results are displayed) ?Labs Reviewed  ?BASIC METABOLIC PANEL - Abnormal; Notable for the following components:  ?    Result Value  ? Glucose, Bld 113 (*)   ? All other components within normal limits  ?CBC - Abnormal; Notable for the following components:  ? WBC 15.4 (*)   ? All other components within normal limits  ?TROPONIN I (HIGH SENSITIVITY)  ?TROPONIN I (HIGH SENSITIVITY)  ? ? ?EKG ?EKG Interpretation ? ?Date/Time:  Thursday Jul 11 2021 13:56:02 EDT ?Ventricular Rate:  67 ?PR Interval:  178 ?QRS Duration: 94 ?QT Interval:  390 ?QTC Calculation: 412 ?R Axis:   -33 ?Text Interpretation: Normal sinus rhythm Left axis deviation nonspecific ST changes No old tracing to compare Confirmed by Pricilla Loveless [id number]) on 07/11/2021 2:56:41 PM ? ?Radiology ?DG Chest 2  View ? ?Result Date: 07/11/2021 ?CLINICAL DATA:  Chest pain. EXAM: CHEST - 2 VIEW COMPARISON:  Chest x-ray 02/20/2017. FINDINGS: No consolidation. No visible pleural effusions or pneumothorax. Similar cardiomediastinal silhouette. Aortic aneurysm better characterized on recent CT. IMPRESSION: 1. No evidence of acute abnormality. 2. Known aortic aneurysm better characterized on recent CT. Electronically Signed   By: Feliberto Harts M.D.   On: 07/11/2021 14:29  ? ?CT Angio Chest/Abd/Pel for Dissection W and/or Wo Contrast ? ?Result Date: 07/11/2021 ?CLINICAL DATA:  Chest pain.  Began last night. EXAM: CT ANGIOGRAPHY CHEST, ABDOMEN AND PELVIS TECHNIQUE: 05/17/2021 Multidetector CT imaging through the chest, abdomen and pelvis was performed using the standard protocol during bolus administration of intravenous contrast. Multiplanar reconstructed images and MIPs were obtained and reviewed to evaluate the vascular anatomy. RADIATION DOSE REDUCTION: This exam was performed according to the departmental dose-optimization program which includes automated exposure control, adjustment of the mA and/or kV according to patient size and/or use of iterative reconstruction technique. CONTRAST:  OMNIPAQUE IOHEXOL 350 MG/ML SOLN COMPARISON:  None Available. FINDINGS: CTA CHEST FINDINGS Cardiovascular: Preferential opacification of the thoracic aorta. No thoracic aortic dissection. Ascending thoracic aortic aneurysm measuring 5.4 cm in diameter. Stable cardiomegaly. No pericardial effusion. Mediastinum/Nodes: No enlarged mediastinal, hilar, or axillary lymph nodes. Thyroid gland and trachea demonstrate no significant findings. Small areas of residual thymic tissue in the anterior mediastinum. Small hiatal hernia and patulous esophagus as can be seen with gastroesophageal reflux. Lungs/Pleura: Lungs are clear. No pleural effusion or pneumothorax. Musculoskeletal: No acute osseous abnormality. No aggressive osseous lesion. Review of  the MIP images confirms the above findings. CTA ABDOMEN AND PELVIS FINDINGS VASCULAR Aorta: Normal caliber aorta without aneurysm, dissection, vasculitis or significant stenosis. Celiac: Patent without evidence of aneurysm, dissection, vasculitis or significant stenosis. SMA: Patent without evidence of aneurysm, dissection, vasculitis or significant stenosis. Renals: Both renal arteries are patent without evidence of aneurysm, dissection, vasculitis, fibromuscular dysplasia or significant stenosis. IMA: Patent without evidence of aneurysm, dissection, vasculitis or significant stenosis. Inflow: Patent without evidence of aneurysm, dissection, vasculitis or significant stenosis. Veins: No obvious venous abnormality within the limitations of this arterial phase study. Review of the MIP images confirms the above findings. NON-VASCULAR Hepatobiliary: No focal liver abnormality is seen. No gallstones, gallbladder wall thickening, or biliary dilatation. Pancreas: Unremarkable. No pancreatic ductal dilatation or surrounding inflammatory changes. Spleen: Normal in size without focal abnormality. Adrenals/Urinary Tract: Adrenal glands are unremarkable. Kidneys are normal, without renal calculi, focal lesion, or hydronephrosis. Bladder is unremarkable. Stomach/Bowel: Stomach is within normal limits. No evidence of bowel wall thickening, distention, or inflammatory changes.  Appendix is normal. Vascular/Lymphatic: No enlarged abdominal or pelvic lymph nodes. Small stable retroperitoneal lymph nodes measuring less than 10 mm. Reproductive: Prostate is unremarkable. Other: No abdominal wall hernia or abnormality. No abdominopelvic ascites. Musculoskeletal: No acute osseous abnormality. No aggressive osseous lesion. Degenerative disease with disc height loss throughout the thoracolumbar spine. Broad-based disc osteophyte complexes at L4-5 and L5-S1 and facet arthropathy. Review of the MIP images confirms the above findings.  IMPRESSION: 1. No thoracic aortic dissection. 2. Ascending thoracic aortic aneurysm measuring 5.4 cm in diameter. Ascending thoracic aortic aneurysm. Recommend semi-annual imaging followup by CTA or MRA and referral to cardiotho

## 2021-07-11 NOTE — ED Notes (Signed)
Patient verbalizes understanding of discharge instructions. Opportunity for questioning and answers were provided. Armband removed by staff, pt discharged from ED ambulatory.   

## 2021-07-11 NOTE — ED Provider Notes (Signed)
4:12 PM ?Patient signed out to me by previous ED physician. Pt is a 51 yo male with hx of aortic aneurysm presenting for back pain.  ? ?CTA demonstrates no dissection ?Labs pending delta trop. Stable ECG and initial trop.  ? ?Plan:  ?DC with cards f/u if everything stable with Dr. Clifton James  ? ?Physical Exam  ?BP (!) 143/98   Pulse (!) 57   Temp 98.5 ?F (36.9 ?C) (Oral)   Resp 17   SpO2 95%  ? ?Physical Exam ?Vitals and nursing note reviewed.  ?Constitutional:   ?   General: He is not in acute distress. ?   Appearance: He is well-developed.  ?HENT:  ?   Head: Normocephalic and atraumatic.  ?Eyes:  ?   Conjunctiva/sclera: Conjunctivae normal.  ?Cardiovascular:  ?   Rate and Rhythm: Normal rate and regular rhythm.  ?   Heart sounds: No murmur heard. ?Pulmonary:  ?   Effort: Pulmonary effort is normal. No respiratory distress.  ?   Breath sounds: Normal breath sounds.  ?Abdominal:  ?   Palpations: Abdomen is soft.  ?   Tenderness: There is no abdominal tenderness.  ?Musculoskeletal:     ?   General: No swelling.  ?   Cervical back: Neck supple.  ?Skin: ?   General: Skin is warm and dry.  ?   Capillary Refill: Capillary refill takes less than 2 seconds.  ?Neurological:  ?   Mental Status: He is alert.  ?Psychiatric:     ?   Mood and Affect: Mood normal.  ? ? ?Procedures  ?Procedures ? ?ED Course / MDM  ?  ?Medical Decision Making ? ?Stable, EKG, troponin, electrolytes, and CTA. ? ?Patient in no distress and overall condition improved here in the ED. Detailed discussions were had with the patient regarding current findings, and need for close f/u with established cardiologist. The patient has been instructed to return immediately if the symptoms worsen in any way for re-evaluation. Patient verbalized understanding and is in agreement with current care plan. All questions answered prior to discharge. ? ? ? ? ?  ?Franne Forts, DO ?07/11/21 1808 ? ?

## 2021-07-15 ENCOUNTER — Institutional Professional Consult (permissible substitution): Payer: Managed Care, Other (non HMO) | Admitting: Surgery

## 2021-07-15 ENCOUNTER — Encounter: Payer: Self-pay | Admitting: Surgery

## 2021-07-15 VITALS — BP 151/102 | HR 66 | Resp 20 | Ht 72.0 in | Wt 267.0 lb

## 2021-07-15 DIAGNOSIS — I712 Thoracic aortic aneurysm, without rupture, unspecified: Secondary | ICD-10-CM | POA: Diagnosis not present

## 2021-07-15 DIAGNOSIS — I350 Nonrheumatic aortic (valve) stenosis: Secondary | ICD-10-CM | POA: Diagnosis not present

## 2021-07-15 NOTE — Progress Notes (Signed)
? ?Cardiothoracic Surgery Consultation ? ?PCP is Shellia Cleverly, PA ?Referring Provider is Kathleene Hazel* ? ?Chief Complaint  ?Patient presents with  ? Thoracic Aortic Aneurysm ?Bicuspid aortic valve  ?  Review studies  ? ? ?HPI: ? ?The patient is a 51 year old gentleman with a history of hypertension and bicuspid aortic valve diagnosed in childhood who had been lost to follow-up and then underwent a 2D echocardiogram at [facility] in January 2023.  This showed a bicuspid aortic valve with some thickening and calcification.  The mean gradient was 28 mmHg with moderate aortic stenosis.  There is also a dilated ascending aorta measured at 4.8 cm.  The patient was referred to Dr. Clifton James and underwent a CTA of the chest on 05/17/2021.  This showed the aortic root to measure 4.0 to 4.2 cm at the level of the sinuses of Valsalva.  The aortic valve was thickened and calcified.  The ascending aorta had a diameter of 5.2 cm.  Proximal aortic arch measured 4.0 cm and the distal arch 2.5 cm.  The descending thoracic aorta measured 2.6 cm.  There is normal arch branch anatomy.  The patient was referred to Dr. Cliffton Asters in our office and plans were made for aortic valve replacement and replacement of the ascending aorta.  I had recently operated on 2 church acquaintances of the patient and he requested see me as a second opinion. ? ?On 07/11/2021 he presented to the emergency room after developing some chest and back discomfort that he described as tightness and generally not feeling well.  He went to bed early but when he woke up he continued to have chest discomfort radiating up into his neck and down into his abdomen.  He denied any shortness of breath.  He had no numbness or weakness.  He underwent a CTA of the chest which ruled out aortic dissection but did show the ascending aortic aneurysm with a maximum diameter of 5.4 cm. ? ?The patient denies any other chest discomfort.  In retrospect  she reports some exertional fatigue particularly at the end of the day over the past few months.  He denies any dizziness or syncope.  He has had no peripheral edema.  Denies orthopnea. ? ?He is here today with his wife.  He has 3 children.  He has been checking his blood pressure at home and measurement of his systolic blood pressure is frequently in the 150-170 range which is new for him. ? ?Past Medical History:  ?Diagnosis Date  ? Aortic stenosis   ? Heart murmur   ? History of chicken pox   ? Hypertension   ? Hypogonadism male   ? Low serum testosterone   ? Non-recurrent bilateral inguinal hernia   ? ? ?Past Surgical History:  ?Procedure Laterality Date  ? KNEE SURGERY    ? ? ?Family History  ?Problem Relation Age of Onset  ? Arthritis Other   ? Cancer Other   ?     Breast  ? Hyperlipidemia Other   ? Heart disease Other   ?There is no family history of bicuspid aortic valve disease, aortic aneurysm, or aortic dissection. ? ?Social History ?Social History  ? ?Tobacco Use  ? Smoking status: Never  ? Smokeless tobacco: Never  ?Substance Use Topics  ? Alcohol use: No  ? Drug use: No  ? ? ?Current Outpatient Medications  ?Medication Sig Dispense Refill  ? amoxicillin (AMOXIL) 500 MG capsule SMARTSIG:4 Capsule(s) By Mouth    ?  EPINEPHrine 0.3 mg/0.3 mL IJ SOAJ injection Inject 0.3 mg into the muscle as needed for anaphylaxis.    ? Garlic 1000 MG CAPS Take by mouth.    ? metoprolol tartrate (LOPRESSOR) 25 MG tablet Take 0.5 tablets (12.5 mg total) by mouth 2 (two) times daily. 60 tablet 0  ? Omega-3 1000 MG CAPS Take by mouth.    ? Testosterone 12.5 MG/ACT (1%) GEL Apply 2 Pump topically daily.    ? ?No current facility-administered medications for this visit.  ? ? ?Allergies  ?Allergen Reactions  ? Bee Venom Anaphylaxis  ? Other Anaphylaxis  ?  Wasps, [place]ets   ? Ibuprofen Nausea And Vomiting and Other (See Comments)  ?  GI Upset ?GI Upset ?  ? ? ?Review of Systems  ?Constitutional:  Positive for fatigue.  Negative for activity change, appetite change, chills and fever.  ?HENT: Negative.    ?     Sees his dentist regularly  ?Eyes: Negative.   ?Respiratory:  Negative for shortness of breath.   ?Cardiovascular:  Negative for chest pain, palpitations and leg swelling.  ?Gastrointestinal: Negative.   ?Endocrine: Negative.   ?Genitourinary: Negative.   ?Musculoskeletal: Negative.   ?Skin: Negative.   ?Allergic/Immunologic: Negative.   ?Neurological:  Negative for dizziness, syncope, light-headedness and headaches.  ?Hematological: Negative.   ?Psychiatric/Behavioral: Negative.    ? ?BP (!) 151/102 (BP Location: Right Arm, Patient Position: Sitting)   Pulse 66   Resp 20   Ht 6' (1.829 m)   Wt 267 lb (121.1 kg)   SpO2 100% Comment: RA  BMI 36.21 kg/m?  ?Physical Exam ?Constitutional:   ?   Appearance: Normal appearance. He is obese.  ?   Comments: BMI is 36.21 kg/m?. ?6 feet, 267 pounds (121.1 kg)  ?HENT:  ?   Head: Normocephalic and atraumatic.  ?   Mouth/Throat:  ?   Mouth: Mucous membranes are moist.  ?   Pharynx: Oropharynx is clear.  ?Eyes:  ?   Extraocular Movements: Extraocular movements intact.  ?   Conjunctiva/sclera: Conjunctivae normal.  ?   Pupils: Pupils are equal, round, and reactive to light.  ?Neck:  ?   Vascular: No carotid bruit.  ?Cardiovascular:  ?   Rate and Rhythm: Normal rate and regular rhythm.  ?   Pulses: Normal pulses.  ?   Heart sounds: Murmur heard.  ?   Comments: 3/6 systolic murmur along the right sternal border.  There is no diastolic murmur. ?Pulmonary:  ?   Effort: Pulmonary effort is normal.  ?   Breath sounds: Normal breath sounds.  ?Abdominal:  ?   General: There is no distension.  ?   Tenderness: There is no abdominal tenderness.  ?Musculoskeletal:     ?   General: No swelling. Normal range of motion.  ?   Cervical back: Normal range of motion and neck supple.  ?Skin: ?   General: Skin is warm and dry.  ?Neurological:  ?   General: No focal deficit present.  ?   Mental Status: He  is alert and oriented to person, place, and time.  ?Psychiatric:     ?   Mood and Affect: Mood normal.     ?   Behavior: Behavior normal.  ? ? ? ?Diagnostic Tests: ? ?Narrative & Impression  ?CLINICAL DATA:  History of bicuspid aortic valve with moderate ?aortic stenosis and dilated thoracic aorta seen by outside ?echocardiography. ?  ?EXAM: ?CT ANGIOGRAPHY CHEST WITH CONTRAST ?  ?TECHNIQUE: ?  Multidetector CT imaging of the chest was performed using the ?standard protocol during bolus administration of intravenous ?contrast. Multiplanar CT image reconstructions and MIPs were ?obtained to evaluate the vascular anatomy. ?  ?RADIATION DOSE REDUCTION: This exam was performed according to the ?departmental dose-optimization program which includes automated ?exposure control, adjustment of the mA and/or kV according to ?patient size and/or use of iterative reconstruction technique. ?  ?CONTRAST:  OMNIPAQUE IOHEXOL 350 MG/ML SOLN ?  ?COMPARISON:  None. ?  ?FINDINGS: ?Cardiovascular: The aortic root measures approximately 4.0-4.2 cm at ?the level of the sinuses of Valsalva. The aortic valve is thickened ?and calcified. The ascending thoracic aorta is difficult to ?accurately measure due to pulsation artifact but is clearly ?significantly dilated with maximal diameter estimated to be ?approximately 5.2 cm. The proximal arch measures 4 cm and the distal ?arch 2.5 cm. The descending thoracic aorta measures 2.6 cm. ?Visualized proximal great vessels demonstrate normal patency and ?normal branching anatomy. No evidence of aortic dissection. ?  ?Top-normal heart size. No pericardial fluid. No significant ?visualized calcified coronary artery plaque. Central pulmonary ?arteries are normal in caliber. ?  ?Mediastinum/Nodes: No enlarged mediastinal, hilar, or axillary lymph ?nodes. Thyroid gland, trachea, and esophagus demonstrate no ?significant findings. ?  ?Lungs/Pleura: There is no evidence of pulmonary  edema, ?consolidation, pneumothorax, nodule or pleural fluid. ?  ?Upper Abdomen: No acute abnormality. ?  ?Musculoskeletal: No chest wall abnormality. No acute or significant ?osseous findings. ?  ?Review of the MIP images confirms

## 2021-07-17 ENCOUNTER — Telehealth: Payer: Self-pay | Admitting: Cardiovascular Disease

## 2021-07-17 MED ORDER — AMLODIPINE BESYLATE 5 MG PO TABS: 5.0000 mg | ORAL_TABLET | Freq: Every day | ORAL | 3 refills | Status: DC

## 2021-07-17 NOTE — Telephone Encounter (Signed)
Pt reports BP are elevated. ?States he woke up around 2 am with a HA and has not gone back to sleep. ?BPs this morning: ? 5:04 am 160/102 ? 6:16 am 163/93 ? 9:59 am 160/96 ?Took Metoprolol 25 mg around 6:30 am this morning.  He increased it to 25 mg on Monday when BP was not getting better. ?HR 63 while one phone, he reports this is normal for him and states he usually runs on the lower side.   ?No symptoms currently besides slight HA. ?BPs yesterday avg 150s/90s. ?He is also having trouble sleeping past several nights.  States he got 4 hours last night and basically none the night before. ? ? ?Discussed w/ [facility] who advised pt start Norvasc 5 mg once daily. ?Pt made aware and agreeable to plan. ?Also, pt advised to take 1/2 tablet (12.5 mg total) once now to see if helps BP - per Dr. Clifton James. ?Aware to call office if new change does not help his BP and/or SE begin on new medication. ?Aware his fatigue could be d/t new medication (Metoprolol), but pt will give this a little longer and continue monitoring for now. ? ? ? ?

## 2021-07-17 NOTE — Telephone Encounter (Signed)
Patient called stating he is returning nurse's call.  ?

## 2021-07-18 NOTE — Telephone Encounter (Signed)
I called the patient to confirm he is aware of the appointment to see Dr. Clifton James on 07/23/21.  He is aware.  His most recent BP today is 126/93 and he noticed at that time his HR was 46.  It was lunchtime at work.  Checking on auto BP cuff.  He remains on metoprolol 25 mg twice daily, and this morning started Norvasc 5 mg.  He will monitor and bring list of readings to appointment next week. ?

## 2021-07-18 NOTE — Telephone Encounter (Signed)
Late entry for 07/16/21 ?Arranged left heart cath for 07/26/21 at 10:30 am. ?Appointment with Dr. Clifton James on 07/23/21 - will give him instructions at that appointment. ? ?Per pt his BP on 07/16/21 was 153/93. ?

## 2021-07-23 ENCOUNTER — Ambulatory Visit: Payer: Managed Care, Other (non HMO) | Admitting: Cardiovascular Disease

## 2021-07-23 ENCOUNTER — Encounter: Payer: Self-pay | Admitting: Cardiovascular Disease

## 2021-07-23 VITALS — BP 136/88 | HR 62 | Ht 72.0 in | Wt 268.4 lb

## 2021-07-23 DIAGNOSIS — I7121 Aneurysm of the ascending aorta, without rupture: Secondary | ICD-10-CM

## 2021-07-23 DIAGNOSIS — I350 Nonrheumatic aortic (valve) stenosis: Secondary | ICD-10-CM | POA: Diagnosis not present

## 2021-07-23 MED ORDER — AMLODIPINE BESYLATE 10 MG PO TABS: 10.0000 mg | ORAL_TABLET | Freq: Every day | ORAL | 3 refills | Status: DC

## 2021-07-23 NOTE — Progress Notes (Signed)
 Chief Complaint  Patient presents with   Follow-up    Aortic stenosis, dilated ascending aorta   History of Present Illness: 51 yo male with history of HTN, dilated ascending aorta and bicuspid aortic valve with moderate aortic stenosis who is here today for cardiac follow up. I saw him as a new patient for the evaluation of aortic stenosis in March 2023. Echo January 2023 at [facility] with LVEF=55-60%. The aortic valve is noted to be bicuspid. Moderate aortic stenosis with mean gradient 28 mmHg. Dilated ascending aorta. He was feeling well.  Chest CTA with dilated ascending aorta at 5.2 cm. He was seen initially by Dr. Lightfoot in the CT surgery office. Surgery was discussed. He was then seen in the ED with chest pain on 07/11/21 and repeat chest CTA May 2023 with dilated ascending aorta at 5.4 cm. He was seen by Dr. Bartle in the CT surgery office and planning underway for AVR and aortic root replacement. He is here today to discuss a cardiac catheterization.   He tells me today that he has been feeling well. No chest pain or dyspnea. BP has been 130-150 systolic at home.   Primary Care Physician: Beane, Lori M, PA  Past Medical History:  Diagnosis Date   Aortic stenosis    Heart murmur    History of chicken pox    Hypertension    Hypogonadism male    Low serum testosterone    Non-recurrent bilateral inguinal hernia    Past Surgical History:  Procedure Laterality Date   KNEE SURGERY      Current Outpatient Medications  Medication Sig Dispense Refill   amLODipine (NORVASC) 10 MG tablet Take 1 tablet (10 mg total) by mouth daily. 90 tablet 3   Garlic 1000 MG CAPS Take 2,000 mg by mouth daily.     metoprolol tartrate (LOPRESSOR) 25 MG tablet Take 0.5 tablets (12.5 mg total) by mouth 2 (two) times daily. 60 tablet 0   Omega-3 1000 MG CAPS Take 1,000 mg by mouth daily.     Testosterone 12.5 MG/ACT (1%) GEL Apply 2 Pump topically daily.     amoxicillin (AMOXIL) 500 MG  capsule Take 2,000 mg by mouth once. (Patient not taking: Reported on 07/23/2021)     EPINEPHrine 0.3 mg/0.3 mL IJ SOAJ injection Inject 0.3 mg into the muscle as needed for anaphylaxis. (Patient not taking: Reported on 07/23/2021)     No current facility-administered medications for this visit.    Allergies  Allergen Reactions   Bee Venom Anaphylaxis   Other Anaphylaxis    Wasps, Yellow Jackets    Ibuprofen Nausea And Vomiting and Other (See Comments)    GI Upset Ok if eat something before    Social History   Socioeconomic History   Marital status: Married    Spouse name: Not on file   Number of children: 3   Years of education: Not on file   Highest education level: Not on file  Occupational History   Occupation: Business Owner  Tobacco Use   Smoking status: Never   Smokeless tobacco: Never  Substance and Sexual Activity   Alcohol use: No   Drug use: No   Sexual activity: Not on file  Other Topics Concern   Not on file  Social History Narrative   Some college, forklift mechanic   Married    3 kids   Social Determinants of Health   Financial Resource Strain: Not on file  Food Insecurity: Not   on file  Transportation Needs: Not on file  Physical Activity: Not on file  Stress: Not on file  Social Connections: Not on file  Intimate Partner Violence: Not on file    Family History  Problem Relation Age of Onset   Arthritis Other    Cancer Other        Breast   Hyperlipidemia Other    Heart disease Other     Review of Systems:  As stated in the HPI and otherwise negative.   BP 136/88   Pulse 62   Ht 6' (1.829 m)   Wt 268 lb 6.4 oz (121.7 kg)   SpO2 93%   BMI 36.40 kg/m   Physical Examination: General: Well developed, well nourished, NAD  HEENT: OP clear, mucus membranes moist  SKIN: warm, dry. No rashes. Neuro: No focal deficits  Musculoskeletal: Muscle strength 5/5 all ext  Psychiatric: Mood and affect normal  Neck: No JVD, no carotid bruits, no  thyromegaly, no lymphadenopathy.  Lungs:Clear bilaterally, no wheezes, rhonci, crackles Cardiovascular: Regular rate and rhythm. Systolic murmur.  Abdomen:Soft. Bowel sounds present. Non-tender.  Extremities: No lower extremity edema. Pulses are 2 + in the bilateral DP/PT.  EKG:  EKG is not ordered today. The ekg ordered today demonstrates  Recent Labs: 07/11/2021: BUN 16; Creatinine, Ser 1.19; Hemoglobin 16.6; Platelets 257; Potassium 4.0; Sodium 137   Lipid Panel No results found for: CHOL, TRIG, HDL, CHOLHDL, VLDL, LDLCALC, LDLDIRECT   Wt Readings from Last 3 Encounters:  07/23/21 268 lb 6.4 oz (121.7 kg)  07/15/21 267 lb (121.1 kg)  06/28/21 263 lb (119.3 kg)     Assessment and Plan:   1. Bicuspid aortic valve stenosis/Thoracic aortic aneurysm: Moderate aortic stenosis with likely bicuspid aortic valve. Mean gradient 28 mmHg.   Fusiform ascending aortic aneurysm 5.4 cm extending from the sinotubular junction up to the proximal aortic arch. Planning for AVR/aortic replacement per Dr. Bartle. Will plan cardiac cath later this week at [facility].  I have reviewed the risks, indications, and alternatives to cardiac catheterization, possible angioplasty, and stenting with the patient. Risks include but are not limited to bleeding, infection, vascular injury, stroke, myocardial infection, arrhythmia, kidney injury, radiation-related injury in the case of prolonged fluoroscopy use, emergency cardiac surgery, and death. The patient understands the risks of serious complication is 1-2 in 1000 with diagnostic cardiac cath and 1-2% or less with angioplasty/stenting.   Labs/ tests ordered today include:   No orders of the defined types were placed in this encounter.    Disposition:   F/U with me after his surgical procedure   Signed, Da Authement, MD 07/23/2021 2:28 PM    [facility] [address], [place], [place]  [address] Phone: [phone]; Fax: [phone]    

## 2021-07-23 NOTE — Patient Instructions (Signed)
Medication Instructions:  ?Your physician has recommended you make the following change in your medication:  ?1.) increase amlodipine to 10 mg daily ? ?*If you need a refill on your cardiac medications before your next appointment, please call your pharmacy* ? ? ?Lab Work: ?none ? ? ?Testing/Procedures: ?none ? ? ?Follow-Up: ?As planned after surgery ? ? ?Other Instructions ? ?[facility] ?[facility] [address] [facility] ?[address], [address] ?[place] [address] ?Dept: [phone] ?Loc: [phone] ? ?COLON RUETH  07/23/2021 ? ?You are scheduled for a Cardiac Catheterization on Friday, May 19 with Dr. Verne Carrow. ? ?1. Please arrive at the Main Entrance A at [facility]: [address] [place], [place] [address] at 8:30 AM (This time is two hours before your procedure to ensure your preparation). Free valet parking service is available.  ? ?Special note: Every effort is made to have your procedure done on time. Please understand that emergencies sometimes delay scheduled procedures. ? ?2. Diet: Do not eat solid foods after midnight.  You may have clear liquids until 5 AM upon the day of the procedure. ? ?3. Labs: completed. ? ?4. Medication instructions in preparation for your procedure: ? ? Contrast Allergy: No ? ?On the morning of your procedure, take Aspirin 81 mg and any morning medicines NOT listed above.  You may use sips of water. ? ?5. Plan to go home the same day, you will only stay overnight if medically necessary. ?6. You MUST have a responsible adult to drive you home. ?7. An adult MUST be with you the first 24 hours after you arrive home. ?8. Bring a current list of your medications, and the last time and date medication taken. ?9. Bring ID and current insurance cards. ?10.Please wear clothes that are easy to get on and off and wear slip-on shoes. ? ?Thank you for allowing [place] to care for you! ?  -- [facility] ? ? ?Important Information About Sugar ? ? ? ? ?  ?

## 2021-07-23 NOTE — H&P (View-Only) (Signed)
Chief Complaint  Patient presents with   Follow-up    Aortic stenosis, dilated ascending aorta   History of Present Illness: 51 yo male with history of HTN, dilated ascending aorta and bicuspid aortic valve with moderate aortic stenosis who is here today for cardiac follow up. I saw him as a new patient for the evaluation of aortic stenosis in March 2023. Echo January 2023 at [facility] center with LVEF=55-60%. The aortic valve is noted to be bicuspid. Moderate aortic stenosis with mean gradient 28 mmHg. Dilated ascending aorta. He was feeling well.  Chest CTA with dilated ascending aorta at 5.2 cm. He was seen initially by Dr. Cliffton Asters in the CT surgery office. Surgery was discussed. He was then seen in the ED with chest pain on 07/11/21 and repeat chest CTA May 2023 with dilated ascending aorta at 5.4 cm. He was seen by Dr. Laneta Simmers in the CT surgery office and planning underway for AVR and aortic root replacement. He is here today to discuss a cardiac catheterization.   He tells me today that he has been feeling well. No chest pain or dyspnea. BP has been 130-150 systolic at home.   Primary Care Physician: Shellia Cleverly, PA  Past Medical History:  Diagnosis Date   Aortic stenosis    Heart murmur    History of chicken pox    Hypertension    Hypogonadism male    Low serum testosterone    Non-recurrent bilateral inguinal hernia    Past Surgical History:  Procedure Laterality Date   KNEE SURGERY      Current Outpatient Medications  Medication Sig Dispense Refill   amLODipine (NORVASC) 10 MG tablet Take 1 tablet (10 mg total) by mouth daily. 90 tablet 3   Garlic 1000 MG CAPS Take 2,000 mg by mouth daily.     metoprolol tartrate (LOPRESSOR) 25 MG tablet Take 0.5 tablets (12.5 mg total) by mouth 2 (two) times daily. 60 tablet 0   Omega-3 1000 MG CAPS Take 1,000 mg by mouth daily.     Testosterone 12.5 MG/ACT (1%) GEL Apply 2 Pump topically daily.     amoxicillin (AMOXIL) 500 MG  capsule Take 2,000 mg by mouth once. (Patient not taking: Reported on 07/23/2021)     EPINEPHrine 0.3 mg/0.3 mL IJ SOAJ injection Inject 0.3 mg into the muscle as needed for anaphylaxis. (Patient not taking: Reported on 07/23/2021)     No current facility-administered medications for this visit.    Allergies  Allergen Reactions   Bee Venom Anaphylaxis   Other Anaphylaxis    Wasps, Yellow Jackets    Ibuprofen Nausea And Vomiting and Other (See Comments)    GI Upset Ok if eat something before    Social History   Socioeconomic History   Marital status: Married    Spouse name: Not on file   Number of children: 3   Years of education: Not on file   Highest education level: Not on file  Occupational History   Occupation: Psychologist, sport and exercise  Tobacco Use   Smoking status: Never   Smokeless tobacco: Never  Substance and Sexual Activity   Alcohol use: No   Drug use: No   Sexual activity: Not on file  Other Topics Concern   Not on file  Social History Narrative   Some college, Chiropodist   Married    3 kids   Social Determinants of Health   Financial Resource Strain: Not on file  Food Insecurity: Not  on file  Transportation Needs: Not on file  Physical Activity: Not on file  Stress: Not on file  Social Connections: Not on file  Intimate Partner Violence: Not on file    Family History  Problem Relation Age of Onset   Arthritis Other    Cancer Other        Breast   Hyperlipidemia Other    Heart disease Other     Review of Systems:  As stated in the HPI and otherwise negative.   BP 136/88   Pulse 62   Ht 6' (1.829 m)   Wt 268 lb 6.4 oz (121.7 kg)   SpO2 93%   BMI 36.40 kg/m   Physical Examination: General: Well developed, well nourished, NAD  HEENT: OP clear, mucus membranes moist  SKIN: warm, dry. No rashes. Neuro: No focal deficits  Musculoskeletal: Muscle strength 5/5 all ext  Psychiatric: Mood and affect normal  Neck: No JVD, no carotid bruits, no  thyromegaly, no lymphadenopathy.  Lungs:Clear bilaterally, no wheezes, rhonci, crackles Cardiovascular: Regular rate and rhythm. Systolic murmur.  Abdomen:Soft. Bowel sounds present. Non-tender.  Extremities: No lower extremity edema. Pulses are 2 + in the bilateral DP/PT.  EKG:  EKG is not ordered today. The ekg ordered today demonstrates  Recent Labs: 07/11/2021: BUN 16; Creatinine, Ser 1.19; Hemoglobin 16.6; Platelets 257; Potassium 4.0; Sodium 137   Lipid Panel No results found for: CHOL, TRIG, HDL, CHOLHDL, VLDL, LDLCALC, LDLDIRECT   Wt Readings from Last 3 Encounters:  07/23/21 268 lb 6.4 oz (121.7 kg)  07/15/21 267 lb (121.1 kg)  06/28/21 263 lb (119.3 kg)     Assessment and Plan:   1. Bicuspid aortic valve stenosis/Thoracic aortic aneurysm: Moderate aortic stenosis with likely bicuspid aortic valve. Mean gradient 28 mmHg.   Fusiform ascending aortic aneurysm 5.4 cm extending from the sinotubular junction up to the proximal aortic arch. Planning for AVR/aortic replacement per Dr. Laneta Simmers. Will plan cardiac cath later this week at [facility].  I have reviewed the risks, indications, and alternatives to cardiac catheterization, possible angioplasty, and stenting with the patient. Risks include but are not limited to bleeding, infection, vascular injury, stroke, myocardial infection, arrhythmia, kidney injury, radiation-related injury in the case of prolonged fluoroscopy use, emergency cardiac surgery, and death. The patient understands the risks of serious complication is 1-2 in 1000 with diagnostic cardiac cath and 1-2% or less with angioplasty/stenting.   Labs/ tests ordered today include:   No orders of the defined types were placed in this encounter.    Disposition:   F/U with me after his surgical procedure   Signed, Verne Carrow, MD 07/23/2021 2:28 PM    [facility] [address] [place], [place], [place]  [address] Phone: [phone]; Fax: [phone]

## 2021-07-25 ENCOUNTER — Telehealth: Payer: Self-pay | Admitting: *Deleted

## 2021-07-25 NOTE — Telephone Encounter (Signed)
Cardiac Catheterization scheduled at [facility] for: Friday Jul 26, 2021 10:30 AM Arrival time and place: [facility] Main Entrance A at: 8:30 AM   Nothing to eat after midnight prior to procedure, clear liquids until 5 AM day of procedure.  Medication instructions: -Usual morning medications can be taken with sips of water including aspirin 81 mg.  Confirmed patient has responsible adult to drive home post procedure and be with patient first 24 hours after arriving home.  Patient reports no new symptoms concerning for COVID-19/no exposure to COVID-19 in the past 10 days.  Reviewed procedure instructions with patient.

## 2021-07-26 ENCOUNTER — Other Ambulatory Visit: Payer: Self-pay

## 2021-07-26 ENCOUNTER — Ambulatory Visit (HOSPITAL_COMMUNITY)
Admission: RE | Admit: 2021-07-26 | Discharge: 2021-07-26 | Disposition: A | Discharge status: Home / Self Care | Payer: Managed Care, Other (non HMO) | Attending: Cardiovascular Disease | Admitting: Cardiovascular Disease

## 2021-07-26 ENCOUNTER — Encounter (HOSPITAL_COMMUNITY)
Admission: RE | Disposition: A | Discharge status: Home / Self Care | Payer: Managed Care, Other (non HMO) | Source: Home / Self Care | Attending: Cardiovascular Disease

## 2021-07-26 DIAGNOSIS — I350 Nonrheumatic aortic (valve) stenosis: Secondary | ICD-10-CM | POA: Diagnosis present

## 2021-07-26 DIAGNOSIS — I10 Essential (primary) hypertension: Secondary | ICD-10-CM | POA: Diagnosis not present

## 2021-07-26 DIAGNOSIS — I7121 Aneurysm of the ascending aorta, without rupture: Secondary | ICD-10-CM | POA: Diagnosis not present

## 2021-07-26 HISTORY — PX: LEFT HEART CATH AND CORONARY ANGIOGRAPHY: CATH118249

## 2021-07-26 SURGERY — LEFT HEART CATH AND CORONARY ANGIOGRAPHY
Anesthesia: LOCAL

## 2021-07-26 MED ORDER — VERAPAMIL HCL 2.5 MG/ML IV SOLN
INTRAVENOUS | Status: DC | PRN
  Administered 2021-07-26: 10 mL via INTRA_ARTERIAL

## 2021-07-26 MED ORDER — SODIUM CHLORIDE 0.9 % WEIGHT BASED INFUSION
3.0000 mL/kg/h | INTRAVENOUS | Status: AC
  Administered 2021-07-26: 3 mL/kg/h via INTRAVENOUS

## 2021-07-26 MED ORDER — IOHEXOL 350 MG/ML SOLN
INTRAVENOUS | Status: DC | PRN
  Administered 2021-07-26: 50 mL

## 2021-07-26 MED ORDER — HEPARIN (PORCINE) IN NACL 1000-0.9 UT/500ML-% IV SOLN
INTRAVENOUS | Status: DC | PRN
  Administered 2021-07-26 (×2): 500 mL

## 2021-07-26 MED ORDER — SODIUM CHLORIDE 0.9 % WEIGHT BASED INFUSION: 1.0000 mL/kg/h | INTRAVENOUS | Status: DC

## 2021-07-26 MED ORDER — SODIUM CHLORIDE 0.9% FLUSH: 3.0000 mL | INTRAVENOUS | Status: DC | PRN

## 2021-07-26 MED ORDER — VERAPAMIL HCL 2.5 MG/ML IV SOLN
INTRAVENOUS | Status: AC
  Filled 2021-07-26: qty 2

## 2021-07-26 MED ORDER — LIDOCAINE HCL (PF) 1 % IJ SOLN
INTRAMUSCULAR | Status: AC
  Filled 2021-07-26: qty 30

## 2021-07-26 MED ORDER — SODIUM CHLORIDE 0.9% FLUSH: 3.0000 mL | Freq: Two times a day (BID) | INTRAVENOUS | Status: DC

## 2021-07-26 MED ORDER — HEPARIN SODIUM (PORCINE) 1000 UNIT/ML IJ SOLN
INTRAMUSCULAR | Status: DC | PRN
Start: 2021-07-26 — End: 2021-07-26
  Administered 2021-07-26: 6000 [IU] via INTRAVENOUS

## 2021-07-26 MED ORDER — FENTANYL CITRATE (PF) 100 MCG/2ML IJ SOLN
INTRAMUSCULAR | Status: AC
  Filled 2021-07-26: qty 2

## 2021-07-26 MED ORDER — MIDAZOLAM HCL 2 MG/2ML IJ SOLN
INTRAMUSCULAR | Status: AC
  Filled 2021-07-26: qty 2

## 2021-07-26 MED ORDER — SODIUM CHLORIDE 0.9 % IV SOLN: 250.0000 mL | INTRAVENOUS | Status: DC | PRN

## 2021-07-26 MED ORDER — MIDAZOLAM HCL 2 MG/2ML IJ SOLN
INTRAMUSCULAR | Status: DC | PRN
  Administered 2021-07-26: 2 mg via INTRAVENOUS

## 2021-07-26 MED ORDER — LIDOCAINE HCL (PF) 1 % IJ SOLN
INTRAMUSCULAR | Status: DC | PRN
  Administered 2021-07-26: 2 mL

## 2021-07-26 MED ORDER — HEPARIN SODIUM (PORCINE) 1000 UNIT/ML IJ SOLN
INTRAMUSCULAR | Status: AC
  Filled 2021-07-26: qty 10

## 2021-07-26 MED ORDER — HEPARIN (PORCINE) IN NACL 1000-0.9 UT/500ML-% IV SOLN
INTRAVENOUS | Status: AC
  Filled 2021-07-26: qty 1000

## 2021-07-26 MED ORDER — FENTANYL CITRATE (PF) 100 MCG/2ML IJ SOLN
INTRAMUSCULAR | Status: DC | PRN
  Administered 2021-07-26: 50 ug via INTRAVENOUS

## 2021-07-26 MED ORDER — ASPIRIN 81 MG PO CHEW: 81.0000 mg | CHEWABLE_TABLET | ORAL | Status: DC

## 2021-07-26 SURGICAL SUPPLY — 10 items
BAND ZEPHYR COMPRESS 30 LONG (HEMOSTASIS) ×1 IMPLANT
CATH 5FR JL3.5 JR4 ANG PIG MP (CATHETERS) ×1 IMPLANT
CATH INFINITI 5FR AL1 (CATHETERS) ×1 IMPLANT
GLIDESHEATH SLEND SS 6F .021 (SHEATH) ×1 IMPLANT
GUIDEWIRE INQWIRE 1.5J.035X260 (WIRE) IMPLANT
INQWIRE 1.5J .035X260CM (WIRE) ×2
KIT HEART LEFT (KITS) ×2 IMPLANT
PACK CARDIAC CATHETERIZATION (CUSTOM PROCEDURE TRAY) ×2 IMPLANT
TRANSDUCER W/STOPCOCK (MISCELLANEOUS) ×2 IMPLANT
TUBING CIL FLEX 10 FLL-RA (TUBING) ×2 IMPLANT

## 2021-07-26 NOTE — Interval H&P Note (Signed)
History and Physical Interval Note:  07/26/2021 8:50 AM  Johnny Henderson  has presented today for surgery, with the diagnosis of pre op.  The various methods of treatment have been discussed with the patient and family. After consideration of risks, benefits and other options for treatment, the patient has consented to  Procedure(s): RIGHT/LEFT HEART CATH AND CORONARY ANGIOGRAPHY (N/A) as a surgical intervention.  The patient's history has been reviewed, patient examined, no change in status, stable for surgery.  I have reviewed the patient's chart and labs.  Questions were answered to the patient's satisfaction.    Cath Lab Visit (complete for each Cath Lab visit)  Clinical Evaluation Leading to the Procedure:   ACS: No.  Non-ACS:    Anginal Classification: No Symptoms  Anti-ischemic medical therapy: Maximal Therapy (2 or more classes of medications)  Non-Invasive Test Results: No non-invasive testing performed  Prior CABG: No previous CABG        Verne Carrow

## 2021-07-29 ENCOUNTER — Encounter: Payer: Self-pay | Admitting: *Deleted

## 2021-07-29 ENCOUNTER — Other Ambulatory Visit: Payer: Self-pay | Admitting: *Deleted

## 2021-07-29 ENCOUNTER — Encounter (HOSPITAL_COMMUNITY): Payer: Self-pay | Admitting: Cardiovascular Disease

## 2021-07-29 DIAGNOSIS — I712 Thoracic aortic aneurysm, without rupture, unspecified: Secondary | ICD-10-CM

## 2021-07-29 DIAGNOSIS — I350 Nonrheumatic aortic (valve) stenosis: Secondary | ICD-10-CM

## 2021-08-06 ENCOUNTER — Other Ambulatory Visit (HOSPITAL_COMMUNITY): Payer: Managed Care, Other (non HMO)

## 2021-08-06 NOTE — Pre-Procedure Instructions (Signed)
Surgical Instructions    Your procedure is scheduled on Friday, June 2nd.  Report to [facility] Main Entrance "A" at 5:30 A.M., then check in with the Admitting office.  Call this number if you have problems the morning of surgery:  [phone]   If you have any questions prior to your surgery date call [phone]: Open Monday-Friday 8am-4pm    Remember:  Do not eat or drink after midnight the night before your surgery     Take these medicines the morning of surgery with A SIP OF WATER:  amLODipine (NORVASC) metoprolol tartrate (LOPRESSOR)   As of today, STOP taking any Aspirin (unless otherwise instructed by your surgeon) Aleve, Naproxen, Ibuprofen, Motrin, Advil, Goody's, BC's, all herbal medications, fish oil, and all vitamins.           Do not wear jewelry Do not wear lotions, powders, colognes, or deodorant. Men may shave face and neck. Do not bring valuables to the hospital.  [facility] is not responsible for any belongings or valuables. .   Do NOT Smoke (Tobacco/Vaping)  24 hours prior to your procedure  If you use a CPAP at night, you may bring your mask for your overnight stay.   Contacts, glasses, hearing aids, dentures or partials may not be worn into surgery, please bring cases for these belongings   For patients admitted to the hospital, discharge time will be determined by your treatment team.   Patients discharged the day of surgery will not be allowed to drive home, and someone needs to stay with them for 24 hours.   SURGICAL WAITING ROOM VISITATION Patients having surgery or a procedure in a hospital may have two support people. Children under the age of 51 must have an adult with them who is not the patient. They may stay in the waiting area during the procedure and may switch out with other visitors. If the patient needs to stay at the hospital during part of their recovery, the visitor guidelines for inpatient rooms apply.  Please refer to the  [facility] website for the visitor guidelines for Inpatients (after your surgery is over and you are in a regular room).       Special instructions:    Oral Hygiene is also important to reduce your risk of infection.  Remember - BRUSH YOUR TEETH THE MORNING OF SURGERY WITH YOUR REGULAR TOOTHPASTE   [facility]- Preparing For Surgery  Before surgery, you can play an important role. Because skin is not sterile, your skin needs to be as free of germs as possible. You can reduce the number of germs on your skin by washing with CHG (chlorahexidine gluconate) Soap before surgery.  CHG is an antiseptic cleaner which kills germs and bonds with the skin to continue killing germs even after washing.     Please do not use if you have an allergy to CHG or antibacterial soaps. If your skin becomes reddened/irritated stop using the CHG.  Do not shave (including legs and underarms) for at least 48 hours prior to first CHG shower. It is OK to shave your face.  Please follow these instructions carefully.     Shower the NIGHT BEFORE SURGERY and the MORNING OF SURGERY with CHG Soap.   If you chose to wash your hair, wash your hair first as usual with your normal shampoo. After you shampoo, rinse your hair and body thoroughly to remove the shampoo.  Then [employer] and genitals (private parts) with your normal soap and  rinse thoroughly to remove soap.  After that Use CHG Soap as you would any other liquid soap. You can apply CHG directly to the skin and wash gently with a scrungie or a clean washcloth.   Apply the CHG Soap to your body ONLY FROM THE NECK DOWN.  Do not use on open wounds or open sores. Avoid contact with your eyes, ears, mouth and genitals (private parts). Wash Face and genitals (private parts)  with your normal soap.   Wash thoroughly, paying special attention to the area where your surgery will be performed.  Thoroughly rinse your body with warm water from the neck down.  DO NOT  shower/wash with your normal soap after using and rinsing off the CHG Soap.  Pat yourself dry with a CLEAN TOWEL.  Wear CLEAN PAJAMAS to bed the night before surgery  Place CLEAN SHEETS on your bed the night before your surgery  DO NOT SLEEP WITH PETS.   Day of Surgery:  Take a shower with CHG soap. Wear Clean/Comfortable clothing the morning of surgery Do not apply any deodorants/lotions.   Remember to brush your teeth WITH YOUR REGULAR TOOTHPASTE.    If you received a COVID test during your pre-op visit, it is requested that you wear a mask when out in public, stay away from anyone that may not be feeling well, and notify your surgeon if you develop symptoms. If you have been in contact with anyone that has tested positive in the last 10 days, please notify your surgeon.    Please read over the following fact sheets that you were given.

## 2021-08-07 ENCOUNTER — Encounter (HOSPITAL_COMMUNITY): Payer: Self-pay

## 2021-08-07 ENCOUNTER — Ambulatory Visit (HOSPITAL_BASED_OUTPATIENT_CLINIC_OR_DEPARTMENT_OTHER)
Admission: RE | Admit: 2021-08-07 | Discharge: 2021-08-07 | Disposition: A | Discharge status: Home / Self Care | Payer: Managed Care, Other (non HMO) | Source: Ambulatory Visit | Attending: Surgery | Admitting: Surgery

## 2021-08-07 ENCOUNTER — Ambulatory Visit (HOSPITAL_COMMUNITY)
Admission: RE | Admit: 2021-08-07 | Discharge: 2021-08-07 | Disposition: A | Discharge status: Home / Self Care | Payer: Managed Care, Other (non HMO) | Source: Ambulatory Visit | Attending: Surgery | Admitting: Surgery

## 2021-08-07 ENCOUNTER — Other Ambulatory Visit: Payer: Self-pay

## 2021-08-07 ENCOUNTER — Encounter (HOSPITAL_COMMUNITY)
Admission: RE | Admit: 2021-08-07 | Discharge: 2021-08-07 | Disposition: A | Discharge status: Home / Self Care | Payer: Managed Care, Other (non HMO) | Source: Ambulatory Visit | Attending: Surgery | Admitting: Surgery

## 2021-08-07 VITALS — BP 150/87 | HR 57 | Temp 98.6°F | Resp 17 | Ht 72.0 in | Wt 272.1 lb

## 2021-08-07 DIAGNOSIS — Z01818 Encounter for other preprocedural examination: Secondary | ICD-10-CM

## 2021-08-07 DIAGNOSIS — Z20822 Contact with and (suspected) exposure to covid-19: Secondary | ICD-10-CM | POA: Insufficient documentation

## 2021-08-07 DIAGNOSIS — I712 Thoracic aortic aneurysm, without rupture, unspecified: Secondary | ICD-10-CM

## 2021-08-07 DIAGNOSIS — I350 Nonrheumatic aortic (valve) stenosis: Secondary | ICD-10-CM

## 2021-08-07 DIAGNOSIS — I10 Essential (primary) hypertension: Secondary | ICD-10-CM | POA: Insufficient documentation

## 2021-08-07 HISTORY — DX: Other specified postprocedural states: Z98.890

## 2021-08-07 LAB — CBC
HCT: 48.1 % (ref 39.0–52.0)
Hemoglobin: 15.9 g/dL (ref 13.0–17.0)
MCH: 32.4 pg (ref 26.0–34.0)
MCHC: 33.1 g/dL (ref 30.0–36.0)
MCV: 98 fL (ref 80.0–100.0)
Platelets: 257 10*3/uL (ref 150–400)
RBC: 4.91 MIL/uL (ref 4.22–5.81)
RDW: 12.5 % (ref 11.5–15.5)
WBC: 10.3 10*3/uL (ref 4.0–10.5)
nRBC: 0 % (ref 0.0–0.2)

## 2021-08-07 LAB — PROTIME-INR
INR: 1 (ref 0.8–1.2)
Prothrombin Time: 12.7 seconds (ref 11.4–15.2)

## 2021-08-07 LAB — URINALYSIS, ROUTINE W REFLEX MICROSCOPIC
Bacteria, UA: NONE SEEN
Bilirubin Urine: NEGATIVE
Glucose, UA: NEGATIVE mg/dL
Ketones, ur: NEGATIVE mg/dL
Leukocytes,Ua: NEGATIVE
Nitrite: NEGATIVE
Protein, ur: NEGATIVE mg/dL
Specific Gravity, Urine: 1.012 (ref 1.005–1.030)
pH: 6 (ref 5.0–8.0)

## 2021-08-07 LAB — COMPREHENSIVE METABOLIC PANEL
ALT: 39 U/L (ref 0–44)
AST: 33 U/L (ref 15–41)
Albumin: 4 g/dL (ref 3.5–5.0)
Alkaline Phosphatase: 66 U/L (ref 38–126)
Anion gap: 7 (ref 5–15)
BUN: 11 mg/dL (ref 6–20)
CO2: 22 mmol/L (ref 22–32)
Calcium: 9.1 mg/dL (ref 8.9–10.3)
Chloride: 107 mmol/L (ref 98–111)
Creatinine, Ser: 0.93 mg/dL (ref 0.61–1.24)
GFR, Estimated: >60 mL/min (ref >60)
Glucose, Bld: 91 mg/dL (ref 70–99)
Potassium: 4.1 mmol/L (ref 3.5–5.1)
Sodium: 136 mmol/L (ref 135–145)
Total Bilirubin: 0.4 mg/dL (ref 0.3–1.2)
Total Protein: 7.3 g/dL (ref 6.5–8.1)

## 2021-08-07 LAB — TYPE AND SCREEN
ABO/RH(D): O POS
Antibody Screen: NEGATIVE

## 2021-08-07 LAB — BLOOD GAS, ARTERIAL
Acid-Base Excess: 3 mmol/L — ABNORMAL HIGH (ref 0.0–2.0)
Bicarbonate: 27.1 mmol/L (ref 20.0–28.0)
Drawn by: 60286
O2 Saturation: 98.7 %
Patient temperature: 37
pCO2 arterial: 39 mmHg (ref 32–48)
pH, Arterial: 7.45 (ref 7.35–7.45)
pO2, Arterial: 91 mmHg (ref 83–108)

## 2021-08-07 LAB — SURGICAL PCR SCREEN
MRSA, PCR: NEGATIVE
Staphylococcus aureus: NEGATIVE

## 2021-08-07 LAB — APTT: aPTT: 30 seconds (ref 24–36)

## 2021-08-07 LAB — SARS CORONAVIRUS 2 (TAT 6-24 HRS): SARS Coronavirus 2: NEGATIVE

## 2021-08-07 LAB — HEMOGLOBIN A1C
Hgb A1c MFr Bld: 5.8 % — ABNORMAL HIGH (ref 4.8–5.6)
Mean Plasma Glucose: 119.76 mg/dL

## 2021-08-07 NOTE — Progress Notes (Signed)
PCP - Lanier Prude, NP Cardiologist - christopher Mcalhany  PPM/ICD - denies   Chest x-ray - 08/07/21 EKG - 07/12/21 Stress Test - denies ECHO - 04/02/21 Cardiac Cath - 07/26/21  Sleep Study - denies  As of today, STOP taking any Aspirin (unless otherwise instructed by your surgeon) Aleve, Naproxen, Ibuprofen, Motrin, Advil, Goody's, BC's, all herbal medications, fish oil, and all vitamins.  ERAS Protcol -no   COVID TEST- done on 08/07/21 PAT   Anesthesia review: no  Patient denies shortness of breath, fever, cough and chest pain at PAT appointment   All instructions explained to the patient, with a verbal understanding of the material. Patient agrees to go over the instructions while at home for a better understanding. Patient also instructed to self quarantine after being tested for COVID-19. The opportunity to ask questions was provided.

## 2021-08-07 NOTE — Progress Notes (Signed)
Pre-AVR testing has been completed. Preliminary results can be found in CV Proc through chart review.   08/07/21 10:24 AM Olen Cordial RVT

## 2021-08-08 ENCOUNTER — Inpatient Hospital Stay: Admit: 2021-08-08 | Payer: Managed Care, Other (non HMO) | Admitting: Thoracic Surgery (Cardiothoracic Vascular Surgery)

## 2021-08-08 ENCOUNTER — Encounter (HOSPITAL_COMMUNITY): Payer: Self-pay | Admitting: Surgery

## 2021-08-08 SURGERY — REPLACEMENT, AORTIC VALVE, OPEN
Anesthesia: General | Site: Chest

## 2021-08-08 MED ORDER — PLASMA-LYTE A IV SOLN
INTRAVENOUS | Status: DC
  Filled 2021-08-08: qty 2.5

## 2021-08-08 MED ORDER — EPINEPHRINE HCL 5 MG/250ML IV SOLN IN NS
0.0000 ug/min | INTRAVENOUS | Status: DC
  Filled 2021-08-08: qty 250

## 2021-08-08 MED ORDER — CEFAZOLIN SODIUM-DEXTROSE 2-4 GM/100ML-% IV SOLN
2.0000 g | INTRAVENOUS | Status: AC
  Administered 2021-08-09 (×2): 2 g via INTRAVENOUS
  Filled 2021-08-08: qty 100

## 2021-08-08 MED ORDER — PHENYLEPHRINE HCL-NACL 20-0.9 MG/250ML-% IV SOLN
30.0000 ug/min | INTRAVENOUS | Status: AC
  Administered 2021-08-09: 20 ug/min via INTRAVENOUS
  Filled 2021-08-08: qty 250

## 2021-08-08 MED ORDER — HEPARIN 30,000 UNITS/1000 ML (OHS) CELLSAVER SOLUTION
Status: DC
  Filled 2021-08-08: qty 1000

## 2021-08-08 MED ORDER — TRANEXAMIC ACID (OHS) PUMP PRIME SOLUTION
2.0000 mg/kg | INTRAVENOUS | Status: DC
  Filled 2021-08-08: qty 2.47

## 2021-08-08 MED ORDER — INSULIN REGULAR(HUMAN) IN NACL 100-0.9 UT/100ML-% IV SOLN
INTRAVENOUS | Status: AC
  Administered 2021-08-09: 1.2 [IU]/h via INTRAVENOUS
  Filled 2021-08-08: qty 100

## 2021-08-08 MED ORDER — MILRINONE LACTATE IN DEXTROSE 20-5 MG/100ML-% IV SOLN
0.3000 ug/kg/min | INTRAVENOUS | Status: DC
  Filled 2021-08-08: qty 100

## 2021-08-08 MED ORDER — POTASSIUM CHLORIDE 2 MEQ/ML IV SOLN
80.0000 meq | INTRAVENOUS | Status: DC
  Filled 2021-08-08: qty 40

## 2021-08-08 MED ORDER — TRANEXAMIC ACID 1000 MG/10ML IV SOLN
1.5000 mg/kg/h | INTRAVENOUS | Status: AC
  Administered 2021-08-09 (×2): 1.5 mg/kg/h via INTRAVENOUS
  Filled 2021-08-08: qty 25

## 2021-08-08 MED ORDER — DEXMEDETOMIDINE HCL IN NACL 400 MCG/100ML IV SOLN
0.1000 ug/kg/h | INTRAVENOUS | Status: AC
  Administered 2021-08-09: .7 ug/kg/h via INTRAVENOUS
  Filled 2021-08-08: qty 100

## 2021-08-08 MED ORDER — NITROGLYCERIN IN D5W 200-5 MCG/ML-% IV SOLN
2.0000 ug/min | INTRAVENOUS | Status: DC
  Filled 2021-08-08: qty 250

## 2021-08-08 MED ORDER — NOREPINEPHRINE 4 MG/250ML-% IV SOLN
0.0000 ug/min | INTRAVENOUS | Status: DC
  Filled 2021-08-08: qty 250

## 2021-08-08 MED ORDER — MANNITOL 20 % IV SOLN
INTRAVENOUS | Status: DC
  Filled 2021-08-08: qty 13

## 2021-08-08 MED ORDER — VANCOMYCIN HCL 1500 MG/300ML IV SOLN
1500.0000 mg | INTRAVENOUS | Status: AC
  Administered 2021-08-09: 1500 mg via INTRAVENOUS
  Filled 2021-08-08: qty 300

## 2021-08-08 MED ORDER — CEFAZOLIN IN SODIUM CHLORIDE 3-0.9 GM/100ML-% IV SOLN
3.0000 g | INTRAVENOUS | Status: DC
  Filled 2021-08-08: qty 100

## 2021-08-08 MED ORDER — TRANEXAMIC ACID (OHS) BOLUS VIA INFUSION
15.0000 mg/kg | INTRAVENOUS | Status: AC
  Administered 2021-08-09: 1851 mg via INTRAVENOUS
  Filled 2021-08-08: qty 1851

## 2021-08-08 NOTE — Anesthesia Preprocedure Evaluation (Addendum)
Anesthesia Evaluation  Patient identified by MRN, date of birth, ID band Patient awake    Reviewed: Allergy & Precautions, NPO status , Patient's Chart, lab work & pertinent test results, reviewed documented beta blocker date and time   History of Anesthesia Complications (+) PONV and history of anesthetic complications  Airway Mallampati: II  TM Distance: <3 FB Neck ROM: Full    Dental  (+) Teeth Intact, Dental Advisory Given   Pulmonary neg pulmonary ROS,    breath sounds clear to auscultation       Cardiovascular hypertension, Pt. on medications and Pt. on home beta blockers + Valvular Problems/Murmurs AS  Rhythm:Regular Rate:Normal     Neuro/Psych negative neurological ROS  negative psych ROS   GI/Hepatic negative GI ROS, Neg liver ROS,   Endo/Other  negative endocrine ROS  Renal/GU negative Renal ROS     Musculoskeletal negative musculoskeletal ROS (+)   Abdominal Normal abdominal exam  (+)   Peds  Hematology negative hematology ROS (+)   Anesthesia Other Findings   Reproductive/Obstetrics                            Anesthesia Physical Anesthesia Plan  ASA: 4  Anesthesia Plan: General   Post-op Pain Management:    Induction: Intravenous  PONV Risk Score and Plan: 3 and Ondansetron, Dexamethasone and Midazolam  Airway Management Planned: Oral ETT  Additional Equipment: Arterial line, CVP, PA Cath, TEE and Ultrasound Guidance Line Placement  Intra-op Plan:   Post-operative Plan: Post-operative intubation/ventilation  Informed Consent: I have reviewed the patients History and Physical, chart, labs and discussed the procedure including the risks, benefits and alternatives for the proposed anesthesia with the patient or authorized representative who has indicated his/her understanding and acceptance.       Plan Discussed with: CRNA  Anesthesia Plan Comments:         Anesthesia Quick Evaluation

## 2021-08-08 NOTE — H&P (Signed)
[address]       [place] [address]             [phone]      Cardiothoracic Surgery Admission History and Physical   PCP is Beane, Terrence Dupont, PA Referring Provider is Verne Carrow D*       Chief Complaint  Patient presents with   Thoracic Aortic Aneurysm Bicuspid aortic valve      Review studies      HPI:   The patient is a 51 year old gentleman with a history of hypertension and bicuspid aortic valve diagnosed in childhood who had been lost to follow-up and then underwent a 2D echocardiogram at [facility] in January 2023.  This showed a bicuspid aortic valve with some thickening and calcification.  The mean gradient was 28 mmHg with moderate aortic stenosis.  There is also a dilated ascending aorta measured at 4.8 cm.  The patient was referred to Dr. Clifton James and underwent a CTA of the chest on 05/17/2021.  This showed the aortic root to measure 4.0 to 4.2 cm at the level of the sinuses of Valsalva.  The aortic valve was thickened and calcified.  The ascending aorta had a diameter of 5.2 cm.  Proximal aortic arch measured 4.0 cm and the distal arch 2.5 cm.  The descending thoracic aorta measured 2.6 cm.  There is normal arch branch anatomy.  The patient was referred to Dr. Cliffton Asters in our office and plans were made for aortic valve replacement and replacement of the ascending aorta.  I had recently operated on 2 church acquaintances of the patient and he requested see me as a second opinion.   On 07/11/2021 he presented to the emergency room after developing some chest and back discomfort that he described as tightness and generally not feeling well.  He went to bed early but when he woke up he continued to have chest discomfort radiating up into his neck and down into his abdomen.  He denied any shortness of breath.  He had no numbness or weakness.  He underwent a CTA of the chest which ruled out aortic dissection but did show the  ascending aortic aneurysm with a maximum diameter of 5.4 cm.   The patient denies any other chest discomfort.  In retrospect she reports some exertional fatigue particularly at the end of the day over the past few months.  He denies any dizziness or syncope.  He has had no peripheral edema.  Denies orthopnea.   He lives with his wife.  He has 3 children.  He has been checking his blood pressure at home and measurement of his systolic blood pressure is frequently in the 150-170 range which is new for him.    He underwent cath by Dr. Clifton James on 07/26/2021 which showed no coronary disease and a peak to peak gradient across the AV of 50 mm Hg with an LVEDP of 16-19.      Past Medical History:  Diagnosis Date   Aortic stenosis     Heart murmur     History of chicken pox     Hypertension     Hypogonadism male     Low serum testosterone     Non-recurrent bilateral inguinal hernia             Past Surgical History:  Procedure Laterality Date   KNEE SURGERY  Family History  Problem Relation Age of Onset   Arthritis Other     Cancer Other          Breast   Hyperlipidemia Other     Heart disease Other    There is no family history of bicuspid aortic valve disease, aortic aneurysm, or aortic dissection.   Social History Social History        Tobacco Use   Smoking status: Never   Smokeless tobacco: Never  Substance Use Topics   Alcohol use: No   Drug use: No            Current Outpatient Medications  Medication Sig Dispense Refill   amoxicillin (AMOXIL) 500 MG capsule SMARTSIG:4 Capsule(s) By Mouth       EPINEPHrine 0.3 mg/0.3 mL IJ SOAJ injection Inject 0.3 mg into the muscle as needed for anaphylaxis.       Garlic 1000 MG CAPS Take by mouth.       metoprolol tartrate (LOPRESSOR) 25 MG tablet Take 0.5 tablets (12.5 mg total) by mouth 2 (two) times daily. 60 tablet 0   Omega-3 1000 MG CAPS Take by mouth.       Testosterone 12.5 MG/ACT (1%) GEL Apply 2 Pump  topically daily.        No current facility-administered medications for this visit.           Allergies  Allergen Reactions   Bee Venom Anaphylaxis   Other Anaphylaxis      Wasps, Yellow Jackets    Ibuprofen Nausea And Vomiting and Other (See Comments)      GI Upset GI Upset        Review of Systems  Constitutional:  Positive for fatigue. Negative for activity change, appetite change, chills and fever.  HENT: Negative.         Sees his dentist regularly  Eyes: Negative.   Respiratory:  Negative for shortness of breath.   Cardiovascular:  Negative for chest pain, palpitations and leg swelling.  Gastrointestinal: Negative.   Endocrine: Negative.   Genitourinary: Negative.   Musculoskeletal: Negative.   Skin: Negative.   Allergic/Immunologic: Negative.   Neurological:  Negative for dizziness, syncope, light-headedness and headaches.  Hematological: Negative.   Psychiatric/Behavioral: Negative.      BP (!) 151/102 (BP Location: Right Arm, Patient Position: Sitting)   Pulse 66   Resp 20   Ht 6' (1.829 m)   Wt 267 lb (121.1 kg)   SpO2 100% Comment: RA  BMI 36.21 kg/m  Physical Exam Constitutional:      Appearance: Normal appearance. He is obese.     Comments: BMI is 36.21 kg/m. 6 feet, 267 pounds (121.1 kg)  HENT:     Head: Normocephalic and atraumatic.     Mouth/Throat:     Mouth: Mucous membranes are moist.     Pharynx: Oropharynx is clear.  Eyes:     Extraocular Movements: Extraocular movements intact.     Conjunctiva/sclera: Conjunctivae normal.     Pupils: Pupils are equal, round, and reactive to light.  Neck:     Vascular: No carotid bruit.  Cardiovascular:     Rate and Rhythm: Normal rate and regular rhythm.     Pulses: Normal pulses.     Heart sounds: Murmur heard.     Comments: 3/6 systolic murmur along the right sternal border.  There is no diastolic murmur. Pulmonary:     Effort: Pulmonary effort is normal.     Breath sounds: Normal breath  sounds.  Abdominal:     General: There is no distension.     Tenderness: There is no abdominal tenderness.  Musculoskeletal:        General: No swelling. Normal range of motion.     Cervical back: Normal range of motion and neck supple.  Skin:    General: Skin is warm and dry.  Neurological:     General: No focal deficit present.     Mental Status: He is alert and oriented to person, place, and time.  Psychiatric:        Mood and Affect: Mood normal.        Behavior: Behavior normal.        Diagnostic Tests:   Narrative & Impression  CLINICAL DATA:  History of bicuspid aortic valve with moderate aortic stenosis and dilated thoracic aorta seen by outside echocardiography.   EXAM: CT ANGIOGRAPHY CHEST WITH CONTRAST   TECHNIQUE: Multidetector CT imaging of the chest was performed using the standard protocol during bolus administration of intravenous contrast. Multiplanar CT image reconstructions and MIPs were obtained to evaluate the vascular anatomy.   RADIATION DOSE REDUCTION: This exam was performed according to the departmental dose-optimization program which includes automated exposure control, adjustment of the mA and/or kV according to patient size and/or use of iterative reconstruction technique.   CONTRAST:  OMNIPAQUE IOHEXOL 350 MG/ML SOLN   COMPARISON:  None.   FINDINGS: Cardiovascular: The aortic root measures approximately 4.0-4.2 cm at the level of the sinuses of Valsalva. The aortic valve is thickened and calcified. The ascending thoracic aorta is difficult to accurately measure due to pulsation artifact but is clearly significantly dilated with maximal diameter estimated to be approximately 5.2 cm. The proximal arch measures 4 cm and the distal arch 2.5 cm. The descending thoracic aorta measures 2.6 cm. Visualized proximal great vessels demonstrate normal patency and normal branching anatomy. No evidence of aortic dissection.   Top-normal heart  size. No pericardial fluid. No significant visualized calcified coronary artery plaque. Central pulmonary arteries are normal in caliber.   Mediastinum/Nodes: No enlarged mediastinal, hilar, or axillary lymph nodes. Thyroid gland, trachea, and esophagus demonstrate no significant findings.   Lungs/Pleura: There is no evidence of pulmonary edema, consolidation, pneumothorax, nodule or pleural fluid.   Upper Abdomen: No acute abnormality.   Musculoskeletal: No chest wall abnormality. No acute or significant osseous findings.   Review of the MIP images confirms the above findings.   IMPRESSION: Degenerated aortic valve with significant associated aneurysmal disease of the ascending thoracic aorta measuring up to approximately 5.2 cm in greatest diameter. Aneurysmal dilatation extends into the proximal arch which measures approximately 4 cm. Ascending thoracic aortic aneurysm. Recommend semi-annual imaging followup by CTA or MRA and referral to cardiothoracic surgery if not already obtained. This recommendation follows 2010 ACCF/AHA/AATS/ACR/ASA/SCA/SCAI/SIR/STS/SVM Guidelines for the Diagnosis and Management of Patients With Thoracic Aortic Disease. Circulation. 2010; 121: N797-K820. Aortic aneurysm NOS (ICD10-I71.9)   Aortic aneurysm NOS (ICD10-I71.9).     Electronically Signed   By: Irish Lack M.D.   On: 05/18/2021 11:50    Impression:   This 51 year old gentleman has a bicuspid aortic valve with moderate stenosis and a 5.3 to 5.4 cm fusiform ascending aortic aneurysm extending from the sinotubular junction up to the proximal aortic arch.  His aortic root is mildly dilated at 4.1 cm.  He does report some exertional fatigue over the past few months.  I agree that aortic valve replacement and replacement of his ascending aortic aneurysm  is indicated to prevent the complications of aortic dissection and progressive aortic stenosis.  His aortic root is only 4.1 cm but his left  coronary artery has a high takeoff essentially at the sinotubular junction and I do not think an anastomosis can be done in a supra coronary location.  I think the safest option is to replace his aortic root with a Bentall procedure.  Replacement of his ascending aorta would require circulatory arrest.  I discussed the alternatives of mechanical and bioprosthetic valves and the pros and cons of both.  He is not interested in taking Coumadin and would rather use a bioprosthetic valve which I think is a reasonable choice at his age given the current generation of bovine pericardial valves with expected durability of 20+ years.     I reviewed the CTA images with the patient and his wife and answered all their questions.  I discussed the operative procedure with the patient and his wife including alternatives, benefits and risks; including but not limited to bleeding, blood transfusion, infection, stroke, myocardial infarction, graft failure, heart block requiring a permanent pacemaker, organ dysfunction, and death.  Josefa HalfAdam R Sheen understands and agrees to proceed.       Plan:   Bentall procedure using a bioprosthetic valved conduit with replacement of the ascending aortic aneurysm using hypothermic circulatory arrest.     Alleen BorneBryan K Kelaiah Escalona, MD [facility] [phone]

## 2021-08-09 ENCOUNTER — Inpatient Hospital Stay (HOSPITAL_COMMUNITY): Payer: Managed Care, Other (non HMO)

## 2021-08-09 ENCOUNTER — Inpatient Hospital Stay (HOSPITAL_COMMUNITY): Payer: Managed Care, Other (non HMO) | Admitting: Certified Registered Nurse Anesthetist

## 2021-08-09 ENCOUNTER — Other Ambulatory Visit: Payer: Self-pay

## 2021-08-09 ENCOUNTER — Encounter (HOSPITAL_COMMUNITY): Payer: Self-pay | Admitting: Surgery

## 2021-08-09 ENCOUNTER — Inpatient Hospital Stay (HOSPITAL_COMMUNITY)
Admission: RE | Disposition: A | Discharge status: Home / Self Care | Payer: Self-pay | Source: Ambulatory Visit | Attending: Surgery

## 2021-08-09 ENCOUNTER — Inpatient Hospital Stay (HOSPITAL_COMMUNITY)
Admission: RE | Admit: 2021-08-09 | Discharge: 2021-08-15 | DRG: 220 | Disposition: A | Discharge status: Home / Self Care | Payer: Managed Care, Other (non HMO) | Source: Ambulatory Visit | Attending: Surgery | Admitting: Surgery

## 2021-08-09 DIAGNOSIS — Z79899 Other long term (current) drug therapy: Secondary | ICD-10-CM

## 2021-08-09 DIAGNOSIS — I712 Thoracic aortic aneurysm, without rupture, unspecified: Secondary | ICD-10-CM

## 2021-08-09 DIAGNOSIS — Z8249 Family history of ischemic heart disease and other diseases of the circulatory system: Secondary | ICD-10-CM | POA: Diagnosis not present

## 2021-08-09 DIAGNOSIS — I9589 Other hypotension: Secondary | ICD-10-CM | POA: Diagnosis not present

## 2021-08-09 DIAGNOSIS — J9811 Atelectasis: Secondary | ICD-10-CM | POA: Diagnosis not present

## 2021-08-09 DIAGNOSIS — I350 Nonrheumatic aortic (valve) stenosis: Secondary | ICD-10-CM

## 2021-08-09 DIAGNOSIS — I10 Essential (primary) hypertension: Secondary | ICD-10-CM | POA: Diagnosis present

## 2021-08-09 DIAGNOSIS — Q231 Congenital insufficiency of aortic valve: Secondary | ICD-10-CM | POA: Diagnosis not present

## 2021-08-09 DIAGNOSIS — Z8679 Personal history of other diseases of the circulatory system: Secondary | ICD-10-CM

## 2021-08-09 DIAGNOSIS — I7121 Aneurysm of the ascending aorta, without rupture: Secondary | ICD-10-CM | POA: Diagnosis present

## 2021-08-09 DIAGNOSIS — Z20822 Contact with and (suspected) exposure to covid-19: Secondary | ICD-10-CM | POA: Diagnosis present

## 2021-08-09 DIAGNOSIS — D72829 Elevated white blood cell count, unspecified: Secondary | ICD-10-CM | POA: Diagnosis not present

## 2021-08-09 DIAGNOSIS — E871 Hypo-osmolality and hyponatremia: Secondary | ICD-10-CM | POA: Diagnosis not present

## 2021-08-09 DIAGNOSIS — I4891 Unspecified atrial fibrillation: Secondary | ICD-10-CM | POA: Diagnosis not present

## 2021-08-09 DIAGNOSIS — Z952 Presence of prosthetic heart valve: Principal | ICD-10-CM

## 2021-08-09 DIAGNOSIS — R0902 Hypoxemia: Secondary | ICD-10-CM | POA: Diagnosis not present

## 2021-08-09 HISTORY — PX: TEE WITHOUT CARDIOVERSION: SHX5443

## 2021-08-09 HISTORY — PX: BENTALL PROCEDURE: SHX5058

## 2021-08-09 HISTORY — PX: THORACIC AORTIC ANEURYSM REPAIR: SHX799

## 2021-08-09 LAB — POCT I-STAT, CHEM 8
BUN: 17 mg/dL (ref 6–20)
BUN: 17 mg/dL (ref 6–20)
BUN: 18 mg/dL (ref 6–20)
BUN: 16 mg/dL (ref 6–20)
BUN: 16 mg/dL (ref 6–20)
BUN: 17 mg/dL (ref 6–20)
Calcium, Ion: 1.16 mmol/L (ref 1.15–1.40)
Calcium, Ion: 1.18 mmol/L (ref 1.15–1.40)
Calcium, Ion: 0.99 mmol/L — ABNORMAL LOW (ref 1.15–1.40)
Calcium, Ion: 0.99 mmol/L — ABNORMAL LOW (ref 1.15–1.40)
Calcium, Ion: 1.03 mmol/L — ABNORMAL LOW (ref 1.15–1.40)
Calcium, Ion: 1.09 mmol/L — ABNORMAL LOW (ref 1.15–1.40)
Chloride: 100 mmol/L (ref 98–111)
Chloride: 101 mmol/L (ref 98–111)
Chloride: 100 mmol/L (ref 98–111)
Chloride: 101 mmol/L (ref 98–111)
Chloride: 101 mmol/L (ref 98–111)
Chloride: 102 mmol/L (ref 98–111)
Creatinine, Ser: 1 mg/dL (ref 0.61–1.24)
Creatinine, Ser: 1 mg/dL (ref 0.61–1.24)
Creatinine, Ser: 1 mg/dL (ref 0.61–1.24)
Creatinine, Ser: 0.9 mg/dL (ref 0.61–1.24)
Creatinine, Ser: 0.8 mg/dL (ref 0.61–1.24)
Creatinine, Ser: 1 mg/dL (ref 0.61–1.24)
Glucose, Bld: 119 mg/dL — ABNORMAL HIGH (ref 70–99)
Glucose, Bld: 112 mg/dL — ABNORMAL HIGH (ref 70–99)
Glucose, Bld: 125 mg/dL — ABNORMAL HIGH (ref 70–99)
Glucose, Bld: 153 mg/dL — ABNORMAL HIGH (ref 70–99)
Glucose, Bld: 155 mg/dL — ABNORMAL HIGH (ref 70–99)
Glucose, Bld: 150 mg/dL — ABNORMAL HIGH (ref 70–99)
HCT: 45 % (ref 39.0–52.0)
HCT: 42 % (ref 39.0–52.0)
HCT: 34 % — ABNORMAL LOW (ref 39.0–52.0)
HCT: 32 % — ABNORMAL LOW (ref 39.0–52.0)
HCT: 33 % — ABNORMAL LOW (ref 39.0–52.0)
HCT: 31 % — ABNORMAL LOW (ref 39.0–52.0)
Hemoglobin: 15.3 g/dL (ref 13.0–17.0)
Hemoglobin: 14.3 g/dL (ref 13.0–17.0)
Hemoglobin: 11.6 g/dL — ABNORMAL LOW (ref 13.0–17.0)
Hemoglobin: 10.9 g/dL — ABNORMAL LOW (ref 13.0–17.0)
Hemoglobin: 11.2 g/dL — ABNORMAL LOW (ref 13.0–17.0)
Hemoglobin: 10.5 g/dL — ABNORMAL LOW (ref 13.0–17.0)
Potassium: 4.2 mmol/L (ref 3.5–5.1)
Potassium: 4.4 mmol/L (ref 3.5–5.1)
Potassium: 4.1 mmol/L (ref 3.5–5.1)
Potassium: 3.9 mmol/L (ref 3.5–5.1)
Potassium: 4.6 mmol/L (ref 3.5–5.1)
Potassium: 3.9 mmol/L (ref 3.5–5.1)
Sodium: 138 mmol/L (ref 135–145)
Sodium: 137 mmol/L (ref 135–145)
Sodium: 131 mmol/L — ABNORMAL LOW (ref 135–145)
Sodium: 136 mmol/L (ref 135–145)
Sodium: 136 mmol/L (ref 135–145)
Sodium: 139 mmol/L (ref 135–145)
TCO2: 26 mmol/L (ref 22–32)
TCO2: 28 mmol/L (ref 22–32)
TCO2: 28 mmol/L (ref 22–32)
TCO2: 26 mmol/L (ref 22–32)
TCO2: 25 mmol/L (ref 22–32)
TCO2: 26 mmol/L (ref 22–32)

## 2021-08-09 LAB — POCT I-STAT 7, (LYTES, BLD GAS, ICA,H+H)
Acid-Base Excess: 3 mmol/L — ABNORMAL HIGH (ref 0.0–2.0)
Acid-Base Excess: 1 mmol/L (ref 0.0–2.0)
Acid-Base Excess: 0 mmol/L (ref 0.0–2.0)
Acid-base deficit: 1 mmol/L (ref 0.0–2.0)
Acid-base deficit: 1 mmol/L (ref 0.0–2.0)
Acid-base deficit: 4 mmol/L — ABNORMAL HIGH (ref 0.0–2.0)
Acid-base deficit: 3 mmol/L — ABNORMAL HIGH (ref 0.0–2.0)
Bicarbonate: 28.8 mmol/L — ABNORMAL HIGH (ref 20.0–28.0)
Bicarbonate: 23.9 mmol/L (ref 20.0–28.0)
Bicarbonate: 24.8 mmol/L (ref 20.0–28.0)
Bicarbonate: 25.3 mmol/L (ref 20.0–28.0)
Bicarbonate: 24 mmol/L (ref 20.0–28.0)
Bicarbonate: 23.3 mmol/L (ref 20.0–28.0)
Bicarbonate: 22.5 mmol/L (ref 20.0–28.0)
Calcium, Ion: 1.06 mmol/L — ABNORMAL LOW (ref 1.15–1.40)
Calcium, Ion: 1.09 mmol/L — ABNORMAL LOW (ref 1.15–1.40)
Calcium, Ion: 1.02 mmol/L — ABNORMAL LOW (ref 1.15–1.40)
Calcium, Ion: 1.01 mmol/L — ABNORMAL LOW (ref 1.15–1.40)
Calcium, Ion: 1.1 mmol/L — ABNORMAL LOW (ref 1.15–1.40)
Calcium, Ion: 1.13 mmol/L — ABNORMAL LOW (ref 1.15–1.40)
Calcium, Ion: 1.03 mmol/L — ABNORMAL LOW (ref 1.15–1.40)
HCT: 35 % — ABNORMAL LOW (ref 39.0–52.0)
HCT: 35 % — ABNORMAL LOW (ref 39.0–52.0)
HCT: 32 % — ABNORMAL LOW (ref 39.0–52.0)
HCT: 31 % — ABNORMAL LOW (ref 39.0–52.0)
HCT: 32 % — ABNORMAL LOW (ref 39.0–52.0)
HCT: 45 % (ref 39.0–52.0)
HCT: 44 % (ref 39.0–52.0)
Hemoglobin: 11.9 g/dL — ABNORMAL LOW (ref 13.0–17.0)
Hemoglobin: 11.9 g/dL — ABNORMAL LOW (ref 13.0–17.0)
Hemoglobin: 10.9 g/dL — ABNORMAL LOW (ref 13.0–17.0)
Hemoglobin: 10.5 g/dL — ABNORMAL LOW (ref 13.0–17.0)
Hemoglobin: 10.9 g/dL — ABNORMAL LOW (ref 13.0–17.0)
Hemoglobin: 15.3 g/dL (ref 13.0–17.0)
Hemoglobin: 15 g/dL (ref 13.0–17.0)
O2 Saturation: 100 %
O2 Saturation: 100 %
O2 Saturation: 100 %
O2 Saturation: 100 %
O2 Saturation: 100 %
O2 Saturation: 89 %
O2 Saturation: 100 %
Patient temperature: 37.2
Patient temperature: 37.3
Potassium: 4.3 mmol/L (ref 3.5–5.1)
Potassium: 3.5 mmol/L (ref 3.5–5.1)
Potassium: 4.6 mmol/L (ref 3.5–5.1)
Potassium: 5.1 mmol/L (ref 3.5–5.1)
Potassium: 3.9 mmol/L (ref 3.5–5.1)
Potassium: 4.7 mmol/L (ref 3.5–5.1)
Potassium: 4.3 mmol/L (ref 3.5–5.1)
Sodium: 139 mmol/L (ref 135–145)
Sodium: 136 mmol/L (ref 135–145)
Sodium: 136 mmol/L (ref 135–145)
Sodium: 136 mmol/L (ref 135–145)
Sodium: 139 mmol/L (ref 135–145)
Sodium: 139 mmol/L (ref 135–145)
Sodium: 141 mmol/L (ref 135–145)
TCO2: 30 mmol/L (ref 22–32)
TCO2: 25 mmol/L (ref 22–32)
TCO2: 26 mmol/L (ref 22–32)
TCO2: 27 mmol/L (ref 22–32)
TCO2: 25 mmol/L (ref 22–32)
TCO2: 25 mmol/L (ref 22–32)
TCO2: 24 mmol/L (ref 22–32)
pCO2 arterial: 50.4 mmHg — ABNORMAL HIGH (ref 32–48)
pCO2 arterial: 39.2 mmHg (ref 32–48)
pCO2 arterial: 36.4 mmHg (ref 32–48)
pCO2 arterial: 42 mmHg (ref 32–48)
pCO2 arterial: 41.5 mmHg (ref 32–48)
pCO2 arterial: 48 mmHg (ref 32–48)
pCO2 arterial: 40.5 mmHg (ref 32–48)
pH, Arterial: 7.365 (ref 7.35–7.45)
pH, Arterial: 7.393 (ref 7.35–7.45)
pH, Arterial: 7.443 (ref 7.35–7.45)
pH, Arterial: 7.388 (ref 7.35–7.45)
pH, Arterial: 7.37 (ref 7.35–7.45)
pH, Arterial: 7.294 — ABNORMAL LOW (ref 7.35–7.45)
pH, Arterial: 7.355 (ref 7.35–7.45)
pO2, Arterial: 385 mmHg — ABNORMAL HIGH (ref 83–108)
pO2, Arterial: 282 mmHg — ABNORMAL HIGH (ref 83–108)
pO2, Arterial: 249 mmHg — ABNORMAL HIGH (ref 83–108)
pO2, Arterial: 319 mmHg — ABNORMAL HIGH (ref 83–108)
pO2, Arterial: 282 mmHg — ABNORMAL HIGH (ref 83–108)
pO2, Arterial: 64 mmHg — ABNORMAL LOW (ref 83–108)
pO2, Arterial: 220 mmHg — ABNORMAL HIGH (ref 83–108)

## 2021-08-09 LAB — POCT I-STAT EG7
Acid-Base Excess: 2 mmol/L (ref 0.0–2.0)
Bicarbonate: 28.3 mmol/L — ABNORMAL HIGH (ref 20.0–28.0)
Calcium, Ion: 1.12 mmol/L — ABNORMAL LOW (ref 1.15–1.40)
HCT: 35 % — ABNORMAL LOW (ref 39.0–52.0)
Hemoglobin: 11.9 g/dL — ABNORMAL LOW (ref 13.0–17.0)
O2 Saturation: 85 %
Potassium: 4.1 mmol/L (ref 3.5–5.1)
Sodium: 137 mmol/L (ref 135–145)
TCO2: 30 mmol/L (ref 22–32)
pCO2, Ven: 53.6 mmHg (ref 44–60)
pH, Ven: 7.331 (ref 7.25–7.43)
pO2, Ven: 54 mmHg — ABNORMAL HIGH (ref 32–45)

## 2021-08-09 LAB — GLUCOSE, CAPILLARY
Glucose-Capillary: 142 mg/dL — ABNORMAL HIGH (ref 70–99)
Glucose-Capillary: 126 mg/dL — ABNORMAL HIGH (ref 70–99)
Glucose-Capillary: 158 mg/dL — ABNORMAL HIGH (ref 70–99)
Glucose-Capillary: 151 mg/dL — ABNORMAL HIGH (ref 70–99)
Glucose-Capillary: 122 mg/dL — ABNORMAL HIGH (ref 70–99)
Glucose-Capillary: 133 mg/dL — ABNORMAL HIGH (ref 70–99)
Glucose-Capillary: 155 mg/dL — ABNORMAL HIGH (ref 70–99)

## 2021-08-09 LAB — HEMOGLOBIN AND HEMATOCRIT, BLOOD
HCT: 30.4 % — ABNORMAL LOW (ref 39.0–52.0)
Hemoglobin: 10.7 g/dL — ABNORMAL LOW (ref 13.0–17.0)

## 2021-08-09 LAB — PLATELET COUNT: Platelets: 157 10*3/uL (ref 150–400)

## 2021-08-09 LAB — CBC
HCT: 46.1 % (ref 39.0–52.0)
HCT: 42.9 % (ref 39.0–52.0)
Hemoglobin: 15.5 g/dL (ref 13.0–17.0)
Hemoglobin: 15 g/dL (ref 13.0–17.0)
MCH: 32.6 pg (ref 26.0–34.0)
MCH: 33.4 pg (ref 26.0–34.0)
MCHC: 33.6 g/dL (ref 30.0–36.0)
MCHC: 35 g/dL (ref 30.0–36.0)
MCV: 96.8 fL (ref 80.0–100.0)
MCV: 95.5 fL (ref 80.0–100.0)
Platelets: 106 10*3/uL — ABNORMAL LOW (ref 150–400)
Platelets: 101 10*3/uL — ABNORMAL LOW (ref 150–400)
RBC: 4.76 MIL/uL (ref 4.22–5.81)
RBC: 4.49 MIL/uL (ref 4.22–5.81)
RDW: 12.5 % (ref 11.5–15.5)
RDW: 12.6 % (ref 11.5–15.5)
WBC: 20.3 10*3/uL — ABNORMAL HIGH (ref 4.0–10.5)
WBC: 19.2 10*3/uL — ABNORMAL HIGH (ref 4.0–10.5)
nRBC: 0 % (ref 0.0–0.2)
nRBC: 0 % (ref 0.0–0.2)

## 2021-08-09 LAB — PROTIME-INR
INR: 0.8 (ref 0.8–1.2)
INR: 0.9 (ref 0.8–1.2)
Prothrombin Time: 10.4 seconds — ABNORMAL LOW (ref 11.4–15.2)
Prothrombin Time: 12 seconds (ref 11.4–15.2)

## 2021-08-09 LAB — ECHO INTRAOPERATIVE TEE
AV Mean grad: 7.7 mmHg
AV Peak grad: 13.6 mmHg
Ao pk vel: 1.85 m/s
Height: 72 in
S' Lateral: 3.35 cm
Weight: 4288 oz

## 2021-08-09 LAB — APTT
aPTT: 42 seconds — ABNORMAL HIGH (ref 24–36)
aPTT: 42 seconds — ABNORMAL HIGH (ref 24–36)

## 2021-08-09 LAB — ABO/RH: ABO/RH(D): O POS

## 2021-08-09 LAB — FIBRINOGEN: Fibrinogen: 200 mg/dL — ABNORMAL LOW (ref 210–475)

## 2021-08-09 SURGERY — BENTALL PROCEDURE
Anesthesia: General | Site: Chest

## 2021-08-09 MED ORDER — CHLORHEXIDINE GLUCONATE 0.12 % MT SOLN
15.0000 mL | Freq: Once | OROMUCOSAL | Status: AC
  Administered 2021-08-09: 15 mL via OROMUCOSAL
  Filled 2021-08-09: qty 15

## 2021-08-09 MED ORDER — BISACODYL 10 MG RE SUPP: 10.0000 mg | Freq: Every day | RECTAL | Status: DC

## 2021-08-09 MED ORDER — COAGULATION FACTOR VIIA RECOMB 1 MG IV SOLR
45.0000 ug/kg | Freq: Once | INTRAVENOUS | Status: AC
  Administered 2021-08-09: 5000 ug via INTRAVENOUS
  Filled 2021-08-09: qty 5

## 2021-08-09 MED ORDER — METOPROLOL TARTRATE 25 MG/10 ML ORAL SUSPENSION: 12.5000 mg | Freq: Two times a day (BID) | ORAL | Status: DC

## 2021-08-09 MED ORDER — METHYLPREDNISOLONE SODIUM SUCC 125 MG IJ SOLR
INTRAMUSCULAR | Status: DC | PRN
  Administered 2021-08-09: 125 mg via INTRAVENOUS

## 2021-08-09 MED ORDER — SODIUM CHLORIDE 0.9% FLUSH: 3.0000 mL | INTRAVENOUS | Status: DC | PRN

## 2021-08-09 MED ORDER — HEMOSTATIC AGENTS (NO CHARGE) OPTIME
TOPICAL | Status: DC | PRN
  Administered 2021-08-09 (×2): 1 via TOPICAL

## 2021-08-09 MED ORDER — FAMOTIDINE IN NACL 20-0.9 MG/50ML-% IV SOLN
20.0000 mg | Freq: Two times a day (BID) | INTRAVENOUS | Status: DC
  Filled 2021-08-09: qty 50

## 2021-08-09 MED ORDER — METOPROLOL TARTRATE 12.5 MG HALF TABLET: 12.5000 mg | ORAL_TABLET | Freq: Two times a day (BID) | ORAL | Status: DC

## 2021-08-09 MED ORDER — FENTANYL CITRATE (PF) 250 MCG/5ML IJ SOLN
INTRAMUSCULAR | Status: AC
  Filled 2021-08-09: qty 5

## 2021-08-09 MED ORDER — LACTATED RINGERS IV SOLN
500.0000 mL | Freq: Once | INTRAVENOUS | Status: AC | PRN
  Administered 2021-08-09: 500 mL via INTRAVENOUS

## 2021-08-09 MED ORDER — BISACODYL 5 MG PO TBEC
10.0000 mg | DELAYED_RELEASE_TABLET | Freq: Every day | ORAL | Status: DC
  Administered 2021-08-11 – 2021-08-12 (×2): 10 mg via ORAL
  Filled 2021-08-09 (×2): qty 2

## 2021-08-09 MED ORDER — PROTAMINE SULFATE 10 MG/ML IV SOLN
INTRAVENOUS | Status: AC
  Filled 2021-08-09: qty 25

## 2021-08-09 MED ORDER — ALBUMIN HUMAN 5 % IV SOLN
250.0000 mL | INTRAVENOUS | Status: AC | PRN
  Administered 2021-08-09 (×4): 12.5 g via INTRAVENOUS
  Filled 2021-08-09 (×2): qty 250

## 2021-08-09 MED ORDER — PHENYLEPHRINE HCL-NACL 20-0.9 MG/250ML-% IV SOLN
0.0000 ug/min | INTRAVENOUS | Status: DC
  Administered 2021-08-09: 44 ug/min via INTRAVENOUS
  Administered 2021-08-10: 42 ug/min via INTRAVENOUS
  Administered 2021-08-10: 45 ug/min via INTRAVENOUS
  Filled 2021-08-09 (×3): qty 250

## 2021-08-09 MED ORDER — CHLORHEXIDINE GLUCONATE 4 % EX LIQD: 30.0000 mL | CUTANEOUS | Status: DC

## 2021-08-09 MED ORDER — ARTIFICIAL TEARS OPHTHALMIC OINT
TOPICAL_OINTMENT | OPHTHALMIC | Status: DC | PRN
  Administered 2021-08-09: 1 via OPHTHALMIC

## 2021-08-09 MED ORDER — LACTATED RINGERS IV SOLN: INTRAVENOUS | Status: DC

## 2021-08-09 MED ORDER — MIDAZOLAM HCL (PF) 10 MG/2ML IJ SOLN
INTRAMUSCULAR | Status: AC
  Filled 2021-08-09: qty 2

## 2021-08-09 MED ORDER — ARTIFICIAL TEARS OPHTHALMIC OINT
TOPICAL_OINTMENT | OPHTHALMIC | Status: AC
  Filled 2021-08-09: qty 3.5

## 2021-08-09 MED ORDER — TRAMADOL HCL 50 MG PO TABS
50.0000 mg | ORAL_TABLET | ORAL | Status: DC | PRN
  Administered 2021-08-10 – 2021-08-15 (×8): 100 mg via ORAL
  Filled 2021-08-09 (×9): qty 2

## 2021-08-09 MED ORDER — LACTATED RINGERS IV SOLN: INTRAVENOUS | Status: DC | PRN

## 2021-08-09 MED ORDER — SODIUM CHLORIDE 0.9% FLUSH
3.0000 mL | Freq: Two times a day (BID) | INTRAVENOUS | Status: DC
  Administered 2021-08-10 – 2021-08-12 (×5): 3 mL via INTRAVENOUS

## 2021-08-09 MED ORDER — MIDAZOLAM HCL 2 MG/2ML IJ SOLN
INTRAMUSCULAR | Status: AC
  Filled 2021-08-09: qty 2

## 2021-08-09 MED ORDER — POTASSIUM CHLORIDE 10 MEQ/50ML IV SOLN: 10.0000 meq | INTRAVENOUS | Status: AC

## 2021-08-09 MED ORDER — MORPHINE SULFATE (PF) 2 MG/ML IV SOLN
1.0000 mg | INTRAVENOUS | Status: DC | PRN
  Administered 2021-08-09: 2 mg via INTRAVENOUS
  Administered 2021-08-09: 4 mg via INTRAVENOUS
  Administered 2021-08-10: 2 mg via INTRAVENOUS
  Administered 2021-08-10: 4 mg via INTRAVENOUS
  Administered 2021-08-10: 2 mg via INTRAVENOUS
  Administered 2021-08-10: 4 mg via INTRAVENOUS
  Administered 2021-08-10 (×2): 2 mg via INTRAVENOUS
  Administered 2021-08-11 – 2021-08-12 (×4): 4 mg via INTRAVENOUS
  Filled 2021-08-09 (×4): qty 2
  Filled 2021-08-09: qty 1
  Filled 2021-08-09: qty 2
  Filled 2021-08-09 (×3): qty 1
  Filled 2021-08-09 (×3): qty 2

## 2021-08-09 MED ORDER — THROMBIN (RECOMBINANT) 20000 UNITS EX SOLR
CUTANEOUS | Status: AC
  Filled 2021-08-09: qty 20000

## 2021-08-09 MED ORDER — METOPROLOL TARTRATE 12.5 MG HALF TABLET
12.5000 mg | ORAL_TABLET | Freq: Once | ORAL | Status: DC
  Filled 2021-08-09: qty 1

## 2021-08-09 MED ORDER — SODIUM CHLORIDE 0.9 % IV SOLN: 250.0000 mL | INTRAVENOUS | Status: DC

## 2021-08-09 MED ORDER — DEXAMETHASONE SODIUM PHOSPHATE 10 MG/ML IJ SOLN
INTRAMUSCULAR | Status: AC
  Filled 2021-08-09: qty 1

## 2021-08-09 MED ORDER — PROPOFOL 10 MG/ML IV BOLUS
INTRAVENOUS | Status: AC
  Filled 2021-08-09: qty 20

## 2021-08-09 MED ORDER — ACETAMINOPHEN 500 MG PO TABS
1000.0000 mg | ORAL_TABLET | Freq: Four times a day (QID) | ORAL | Status: AC
  Administered 2021-08-10 – 2021-08-14 (×16): 1000 mg via ORAL
  Filled 2021-08-09 (×16): qty 2

## 2021-08-09 MED ORDER — PROTAMINE SULFATE 10 MG/ML IV SOLN
INTRAVENOUS | Status: DC | PRN
  Administered 2021-08-09: 350 mg via INTRAVENOUS

## 2021-08-09 MED ORDER — ROCURONIUM BROMIDE 10 MG/ML (PF) SYRINGE
PREFILLED_SYRINGE | INTRAVENOUS | Status: AC
  Filled 2021-08-09: qty 20

## 2021-08-09 MED ORDER — SODIUM CHLORIDE 0.9% IV SOLUTION: Freq: Once | INTRAVENOUS | Status: DC

## 2021-08-09 MED ORDER — VANCOMYCIN HCL IN DEXTROSE 1-5 GM/200ML-% IV SOLN
1000.0000 mg | Freq: Once | INTRAVENOUS | Status: AC
Start: 2021-08-09 — End: 2021-08-09
  Administered 2021-08-09: 1000 mg via INTRAVENOUS
  Filled 2021-08-09: qty 200

## 2021-08-09 MED ORDER — CALCIUM CHLORIDE 10 % IV SOLN
INTRAVENOUS | Status: DC | PRN
  Administered 2021-08-09 (×3): 150 mg via INTRAVENOUS

## 2021-08-09 MED ORDER — VASOPRESSIN 20 UNIT/ML IV SOLN
INTRAVENOUS | Status: AC
  Filled 2021-08-09: qty 1

## 2021-08-09 MED ORDER — DEXTROSE 50 % IV SOLN: 0.0000 mL | INTRAVENOUS | Status: DC | PRN

## 2021-08-09 MED ORDER — PANTOPRAZOLE SODIUM 40 MG PO TBEC
40.0000 mg | DELAYED_RELEASE_TABLET | Freq: Every day | ORAL | Status: DC
  Administered 2021-08-11 – 2021-08-15 (×5): 40 mg via ORAL
  Filled 2021-08-09 (×5): qty 1

## 2021-08-09 MED ORDER — HEPARIN SODIUM (PORCINE) 1000 UNIT/ML IJ SOLN
INTRAMUSCULAR | Status: AC
  Filled 2021-08-09: qty 10

## 2021-08-09 MED ORDER — ~~LOC~~ CARDIAC SURGERY, PATIENT & FAMILY EDUCATION
Freq: Once | Status: DC
  Filled 2021-08-09: qty 1

## 2021-08-09 MED ORDER — PHENYLEPHRINE 80 MCG/ML (10ML) SYRINGE FOR IV PUSH (FOR BLOOD PRESSURE SUPPORT)
PREFILLED_SYRINGE | INTRAVENOUS | Status: DC | PRN
  Administered 2021-08-09 (×3): 80 ug via INTRAVENOUS

## 2021-08-09 MED ORDER — ACETAMINOPHEN 160 MG/5ML PO SOLN: 650.0000 mg | Freq: Once | ORAL | Status: AC

## 2021-08-09 MED ORDER — ONDANSETRON HCL 4 MG/2ML IJ SOLN
INTRAMUSCULAR | Status: DC | PRN
  Administered 2021-08-09: 4 mg via INTRAVENOUS

## 2021-08-09 MED ORDER — SODIUM BICARBONATE 8.4 % IV SOLN
50.0000 meq | Freq: Once | INTRAVENOUS | Status: AC
Start: 2021-08-09 — End: 2021-08-09
  Administered 2021-08-09: 50 meq via INTRAVENOUS

## 2021-08-09 MED ORDER — SODIUM CHLORIDE 0.9 % IV SOLN: INTRAVENOUS | Status: DC

## 2021-08-09 MED ORDER — PROPOFOL 10 MG/ML IV BOLUS
INTRAVENOUS | Status: DC | PRN
  Administered 2021-08-09: 100 mg via INTRAVENOUS
  Administered 2021-08-09: 150 mg via INTRAVENOUS

## 2021-08-09 MED ORDER — MIDAZOLAM HCL (PF) 5 MG/ML IJ SOLN
INTRAMUSCULAR | Status: DC | PRN
  Administered 2021-08-09 (×2): 2 mg via INTRAVENOUS
  Administered 2021-08-09: 1 mg via INTRAVENOUS
  Administered 2021-08-09 (×3): 2 mg via INTRAVENOUS
  Administered 2021-08-09: 1 mg via INTRAVENOUS

## 2021-08-09 MED ORDER — MIDAZOLAM HCL 2 MG/2ML IJ SOLN
2.0000 mg | INTRAMUSCULAR | Status: DC | PRN
  Administered 2021-08-09 – 2021-08-10 (×5): 2 mg via INTRAVENOUS
  Filled 2021-08-09 (×5): qty 2

## 2021-08-09 MED ORDER — DOCUSATE SODIUM 100 MG PO CAPS
200.0000 mg | ORAL_CAPSULE | Freq: Every day | ORAL | Status: DC
  Administered 2021-08-11 – 2021-08-12 (×2): 200 mg via ORAL
  Filled 2021-08-09 (×2): qty 2

## 2021-08-09 MED ORDER — SODIUM BICARBONATE 8.4 % IV SOLN
50.0000 meq | Freq: Once | INTRAVENOUS | Status: AC
  Administered 2021-08-09: 50 meq via INTRAVENOUS

## 2021-08-09 MED ORDER — SODIUM CHLORIDE 0.9 % IV SOLN: INTRAVENOUS | Status: DC | PRN

## 2021-08-09 MED ORDER — DEXMEDETOMIDINE HCL IN NACL 400 MCG/100ML IV SOLN
0.0000 ug/kg/h | INTRAVENOUS | Status: DC
  Administered 2021-08-09 – 2021-08-10 (×2): 0.7 ug/kg/h via INTRAVENOUS
  Filled 2021-08-09 (×3): qty 100

## 2021-08-09 MED ORDER — OXYCODONE HCL 5 MG PO TABS
5.0000 mg | ORAL_TABLET | ORAL | Status: DC | PRN
  Administered 2021-08-10 – 2021-08-14 (×19): 10 mg via ORAL
  Filled 2021-08-09 (×20): qty 2

## 2021-08-09 MED ORDER — ACETAMINOPHEN 650 MG RE SUPP
650.0000 mg | Freq: Once | RECTAL | Status: AC
Start: 2021-08-09 — End: 2021-08-09
  Administered 2021-08-09: 650 mg via RECTAL

## 2021-08-09 MED ORDER — FAMOTIDINE IN NACL 20-0.9 MG/50ML-% IV SOLN
20.0000 mg | Freq: Two times a day (BID) | INTRAVENOUS | Status: AC
  Administered 2021-08-09 – 2021-08-10 (×2): 20 mg via INTRAVENOUS
  Filled 2021-08-09: qty 50

## 2021-08-09 MED ORDER — 0.9 % SODIUM CHLORIDE (POUR BTL) OPTIME
TOPICAL | Status: DC | PRN
  Administered 2021-08-09: 5000 mL

## 2021-08-09 MED ORDER — THROMBIN 20000 UNITS EX SOLR: OROMUCOSAL | Status: DC | PRN

## 2021-08-09 MED ORDER — METOPROLOL TARTRATE 5 MG/5ML IV SOLN: 2.5000 mg | INTRAVENOUS | Status: DC | PRN

## 2021-08-09 MED ORDER — CHLORHEXIDINE GLUCONATE 0.12% ORAL RINSE (MEDLINE KIT)
15.0000 mL | Freq: Two times a day (BID) | OROMUCOSAL | Status: DC
  Administered 2021-08-09 – 2021-08-10 (×2): 15 mL via OROMUCOSAL

## 2021-08-09 MED ORDER — FENTANYL CITRATE (PF) 250 MCG/5ML IJ SOLN
INTRAMUSCULAR | Status: DC | PRN
  Administered 2021-08-09: 100 ug via INTRAVENOUS
  Administered 2021-08-09: 50 ug via INTRAVENOUS
  Administered 2021-08-09: 250 ug via INTRAVENOUS
  Administered 2021-08-09: 50 ug via INTRAVENOUS
  Administered 2021-08-09: 250 ug via INTRAVENOUS
  Administered 2021-08-09 (×2): 100 ug via INTRAVENOUS

## 2021-08-09 MED ORDER — HEPARIN SODIUM (PORCINE) 1000 UNIT/ML IJ SOLN
INTRAMUSCULAR | Status: AC
  Filled 2021-08-09: qty 1

## 2021-08-09 MED ORDER — DEXAMETHASONE SODIUM PHOSPHATE 10 MG/ML IJ SOLN
INTRAMUSCULAR | Status: DC | PRN
  Administered 2021-08-09: 4 mg via INTRAVENOUS

## 2021-08-09 MED ORDER — ONDANSETRON HCL 4 MG/2ML IJ SOLN: 4.0000 mg | Freq: Four times a day (QID) | INTRAMUSCULAR | Status: DC | PRN

## 2021-08-09 MED ORDER — DIPHENHYDRAMINE HCL 50 MG/ML IJ SOLN
INTRAMUSCULAR | Status: AC
  Filled 2021-08-09: qty 1

## 2021-08-09 MED ORDER — MAGNESIUM SULFATE 4 GM/100ML IV SOLN
4.0000 g | Freq: Once | INTRAVENOUS | Status: AC
Start: 2021-08-09 — End: 2021-08-09
  Administered 2021-08-09: 4 g via INTRAVENOUS
  Filled 2021-08-09: qty 100

## 2021-08-09 MED ORDER — PROTAMINE SULFATE 10 MG/ML IV SOLN
INTRAVENOUS | Status: AC
  Filled 2021-08-09: qty 10

## 2021-08-09 MED ORDER — NITROGLYCERIN IN D5W 200-5 MCG/ML-% IV SOLN: 0.0000 ug/min | INTRAVENOUS | Status: DC

## 2021-08-09 MED ORDER — ORAL CARE MOUTH RINSE
15.0000 mL | OROMUCOSAL | Status: DC
  Administered 2021-08-09 – 2021-08-10 (×6): 15 mL via OROMUCOSAL

## 2021-08-09 MED ORDER — DEXMEDETOMIDINE HCL IN NACL 400 MCG/100ML IV SOLN
INTRAVENOUS | Status: AC
  Filled 2021-08-09: qty 100

## 2021-08-09 MED ORDER — ORAL CARE MOUTH RINSE: 15.0000 mL | Freq: Once | OROMUCOSAL | Status: AC

## 2021-08-09 MED ORDER — SODIUM CHLORIDE 0.45 % IV SOLN: INTRAVENOUS | Status: DC | PRN

## 2021-08-09 MED ORDER — INSULIN REGULAR(HUMAN) IN NACL 100-0.9 UT/100ML-% IV SOLN
INTRAVENOUS | Status: DC
  Administered 2021-08-09: 2.2 [IU]/h via INTRAVENOUS

## 2021-08-09 MED ORDER — DIPHENHYDRAMINE HCL 50 MG/ML IJ SOLN
50.0000 mg | Freq: Once | INTRAMUSCULAR | Status: AC
Start: 2021-08-09 — End: 2021-08-09
  Administered 2021-08-09: 50 mg via INTRAVENOUS

## 2021-08-09 MED ORDER — HEPARIN SODIUM (PORCINE) 1000 UNIT/ML IJ SOLN
INTRAMUSCULAR | Status: DC | PRN
  Administered 2021-08-09: 37000 [IU] via INTRAVENOUS

## 2021-08-09 MED ORDER — ASPIRIN 325 MG PO TBEC
325.0000 mg | DELAYED_RELEASE_TABLET | Freq: Every day | ORAL | Status: DC
  Administered 2021-08-11 – 2021-08-12 (×2): 325 mg via ORAL
  Filled 2021-08-09 (×2): qty 1

## 2021-08-09 MED ORDER — CHLORHEXIDINE GLUCONATE CLOTH 2 % EX PADS
6.0000 | MEDICATED_PAD | Freq: Every day | CUTANEOUS | Status: DC
  Administered 2021-08-09 – 2021-08-12 (×4): 6 via TOPICAL

## 2021-08-09 MED ORDER — ROCURONIUM BROMIDE 10 MG/ML (PF) SYRINGE
PREFILLED_SYRINGE | INTRAVENOUS | Status: DC | PRN
  Administered 2021-08-09: 50 mg via INTRAVENOUS
  Administered 2021-08-09: 100 mg via INTRAVENOUS
  Administered 2021-08-09 (×2): 50 mg via INTRAVENOUS
  Administered 2021-08-09: 30 mg via INTRAVENOUS
  Administered 2021-08-09: 50 mg via INTRAVENOUS

## 2021-08-09 MED ORDER — ONDANSETRON HCL 4 MG/2ML IJ SOLN
INTRAMUSCULAR | Status: AC
  Filled 2021-08-09: qty 2

## 2021-08-09 MED ORDER — SODIUM CHLORIDE 0.9 % IV SOLN
1.5000 mg/kg/h | INTRAVENOUS | Status: DC
  Filled 2021-08-09: qty 25

## 2021-08-09 MED ORDER — PLASMA-LYTE A IV SOLN: INTRAVENOUS | Status: DC | PRN

## 2021-08-09 MED ORDER — CEFAZOLIN SODIUM-DEXTROSE 2-4 GM/100ML-% IV SOLN
2.0000 g | Freq: Three times a day (TID) | INTRAVENOUS | Status: AC
  Administered 2021-08-09 – 2021-08-11 (×6): 2 g via INTRAVENOUS
  Filled 2021-08-09 (×6): qty 100

## 2021-08-09 MED ORDER — PHENYLEPHRINE 80 MCG/ML (10ML) SYRINGE FOR IV PUSH (FOR BLOOD PRESSURE SUPPORT)
PREFILLED_SYRINGE | INTRAVENOUS | Status: AC
  Filled 2021-08-09: qty 10

## 2021-08-09 MED ORDER — EPINEPHRINE 1 MG/10ML IJ SOSY
0.2000 mg | PREFILLED_SYRINGE | Freq: Once | INTRAMUSCULAR | Status: AC
  Administered 2021-08-09: 0.2 mg via INTRAVENOUS

## 2021-08-09 MED ORDER — CHLORHEXIDINE GLUCONATE 0.12 % MT SOLN
15.0000 mL | OROMUCOSAL | Status: AC
  Administered 2021-08-09: 15 mL via OROMUCOSAL

## 2021-08-09 MED ORDER — ASPIRIN 81 MG PO CHEW: 324.0000 mg | CHEWABLE_TABLET | Freq: Every day | ORAL | Status: DC

## 2021-08-09 MED ORDER — ACETAMINOPHEN 160 MG/5ML PO SOLN
1000.0000 mg | Freq: Four times a day (QID) | ORAL | Status: AC
  Administered 2021-08-10: 1000 mg
  Filled 2021-08-09: qty 40.6

## 2021-08-09 SURGICAL SUPPLY — 105 items
ADAPTER CARDIO PERF ANTE/RETRO (ADAPTER) ×3 IMPLANT
APPLICATOR TIP COSEAL (VASCULAR PRODUCTS) IMPLANT
BAG DECANTER FOR FLEXI CONT (MISCELLANEOUS) ×3 IMPLANT
BLADE CLIPPER SURG (BLADE) ×3 IMPLANT
BLADE STERNUM SYSTEM 6 (BLADE) ×3 IMPLANT
BLADE SURG 15 STRL LF DISP TIS (BLADE) ×2 IMPLANT
BLADE SURG 15 STRL SS (BLADE) ×3
CANISTER SUCT 3000ML PPV (MISCELLANEOUS) ×3 IMPLANT
CANNULA ARTERIAL NVNT 3/8 22FR (MISCELLANEOUS) ×1 IMPLANT
CANNULA ARTERIAL VENT 3/8 20FR (CANNULA) IMPLANT
CANNULA GUNDRY RCSP 15FR (MISCELLANEOUS) ×4 IMPLANT
CANNULA SUMP PERICARDIAL (CANNULA) ×1 IMPLANT
CATH CPB KIT HENDRICKSON (MISCELLANEOUS) ×1 IMPLANT
CATH HEART VENT LEFT (CATHETERS) ×2 IMPLANT
CATH ROBINSON RED A/P 18FR (CATHETERS) ×9 IMPLANT
CATH THORACIC 36FR (CATHETERS) ×3 IMPLANT
CATH THORACIC 36FR RT ANG (CATHETERS) ×3 IMPLANT
CNTNR URN SCR LID CUP LEK RST (MISCELLANEOUS) ×2 IMPLANT
CONT SPEC 4OZ STRL OR WHT (MISCELLANEOUS) ×6
CONTAINER PROTECT SURGISLUSH (MISCELLANEOUS) ×4 IMPLANT
DRAPE WARM FLUID 44X44 (DRAPES) ×1 IMPLANT
DRSG COVADERM 4X14 (GAUZE/BANDAGES/DRESSINGS) ×3 IMPLANT
ELECT CAUTERY BLADE 6.4 (BLADE) ×3 IMPLANT
ELECT REM PT RETURN 9FT ADLT (ELECTROSURGICAL) ×6
ELECTRODE REM PT RTRN 9FT ADLT (ELECTROSURGICAL) ×4 IMPLANT
FELT TEFLON 1X6 (MISCELLANEOUS) ×4 IMPLANT
FELT TEFLON 6X6 (MISCELLANEOUS) IMPLANT
GAUZE 4X4 16PLY ~~LOC~~+RFID DBL (SPONGE) ×3 IMPLANT
GAUZE SPONGE 4X4 12PLY STRL (GAUZE/BANDAGES/DRESSINGS) ×3 IMPLANT
GLOVE BIO SURGEON STRL SZ 6 (GLOVE) ×1 IMPLANT
GLOVE BIO SURGEON STRL SZ 6.5 (GLOVE) IMPLANT
GLOVE BIO SURGEON STRL SZ7 (GLOVE) IMPLANT
GLOVE BIO SURGEON STRL SZ7.5 (GLOVE) IMPLANT
GLOVE BIOGEL PI IND STRL 6 (GLOVE) IMPLANT
GLOVE BIOGEL PI INDICATOR 6 (GLOVE) ×2
GLOVE SURG MICRO LTX SZ7 (GLOVE) ×6 IMPLANT
GOWN STRL REUS W/ TWL LRG LVL3 (GOWN DISPOSABLE) ×8 IMPLANT
GOWN STRL REUS W/ TWL XL LVL3 (GOWN DISPOSABLE) ×2 IMPLANT
GOWN STRL REUS W/TWL LRG LVL3 (GOWN DISPOSABLE) ×12
GOWN STRL REUS W/TWL XL LVL3 (GOWN DISPOSABLE) ×3
GRAFT HEMASHIELD 30X10 (Vascular Products) ×1 IMPLANT
HEMOSTAT POWDER SURGIFOAM 1G (HEMOSTASIS) ×9 IMPLANT
HEMOSTAT SURGICEL 2X14 (HEMOSTASIS) ×3 IMPLANT
INSERT FOGARTY 61MM (MISCELLANEOUS) IMPLANT
INSERT FOGARTY SM (MISCELLANEOUS) ×3 IMPLANT
INSERT FOGARTY XLG (MISCELLANEOUS) ×1 IMPLANT
KIT BASIN OR (CUSTOM PROCEDURE TRAY) ×3 IMPLANT
KIT CATH CPB BARTLE (MISCELLANEOUS) ×3 IMPLANT
KIT SUCTION CATH 14FR (SUCTIONS) ×3 IMPLANT
KIT TURNOVER KIT B (KITS) ×3 IMPLANT
LINE VENT (MISCELLANEOUS) ×1 IMPLANT
LOOP VESSEL SUPERMAXI WHITE (MISCELLANEOUS) ×1 IMPLANT
NDL AORTIC AIR ASPIRATING (NEEDLE) IMPLANT
NEEDLE AORTIC AIR ASPIRATING (NEEDLE) IMPLANT
NS IRRIG 1000ML POUR BTL (IV SOLUTION) ×15 IMPLANT
PACK E OPEN HEART (SUTURE) ×3 IMPLANT
PACK OPEN HEART (CUSTOM PROCEDURE TRAY) ×3 IMPLANT
PAD ARMBOARD 7.5X6 YLW CONV (MISCELLANEOUS) ×6 IMPLANT
PENCIL BUTTON HOLSTER BLD 10FT (ELECTRODE) ×1 IMPLANT
POSITIONER HEAD DONUT 9IN (MISCELLANEOUS) ×3 IMPLANT
SEALANT SURG COSEAL 4ML (VASCULAR PRODUCTS) IMPLANT
SEALANT SURG COSEAL 8ML (VASCULAR PRODUCTS) ×1 IMPLANT
SET MPS 3-ND DEL (MISCELLANEOUS) ×1 IMPLANT
SET VEIN GRAFT PERF (SET/KITS/TRAYS/PACK) IMPLANT
SPONGE T-LAP 18X18 ~~LOC~~+RFID (SPONGE) ×12 IMPLANT
SPONGE T-LAP 4X18 ~~LOC~~+RFID (SPONGE) ×8 IMPLANT
STOPCOCK 4 WAY LG BORE MALE ST (IV SETS) IMPLANT
SUT BONE WAX W31G (SUTURE) ×3 IMPLANT
SUT EB EXC GRN/WHT 2-0 V-5 (SUTURE) ×6 IMPLANT
SUT ETHIBON EXCEL 2-0 V-5 (SUTURE) ×1 IMPLANT
SUT ETHIBOND 2 0 SH (SUTURE)
SUT ETHIBOND 2 0 SH 36X2 (SUTURE) IMPLANT
SUT ETHIBOND V-5 VALVE (SUTURE) ×2 IMPLANT
SUT PROLENE 3 0 RB 1 (SUTURE) IMPLANT
SUT PROLENE 3 0 SH 1 (SUTURE) ×3 IMPLANT
SUT PROLENE 3 0 SH 48 (SUTURE) ×8 IMPLANT
SUT PROLENE 3 0 SH DA (SUTURE) ×12 IMPLANT
SUT PROLENE 3 0 SH1 36 (SUTURE) ×2 IMPLANT
SUT PROLENE 4 0 RB 1 (SUTURE) ×18
SUT PROLENE 4 0 SH DA (SUTURE) IMPLANT
SUT PROLENE 4-0 RB1 .5 CRCL 36 (SUTURE) ×8 IMPLANT
SUT PROLENE 5 0 C 1 36 (SUTURE) ×3 IMPLANT
SUT PROLENE 6 0 C 1 30 (SUTURE) IMPLANT
SUT SILK 1 TIES 10X30 (SUTURE) IMPLANT
SUT SILK 2 0 SH CR/8 (SUTURE) ×3 IMPLANT
SUT STEEL 6MS V (SUTURE) ×1 IMPLANT
SUT STEEL STERNAL CCS#1 18IN (SUTURE) ×1 IMPLANT
SUT STEEL SZ 6 DBL 3X14 BALL (SUTURE) ×2 IMPLANT
SUT VIC AB 1 CTX 36 (SUTURE) ×6
SUT VIC AB 1 CTX36XBRD ANBCTR (SUTURE) ×4 IMPLANT
SUT VIC AB 2-0 CT1 27 (SUTURE)
SUT VIC AB 2-0 CT1 TAPERPNT 27 (SUTURE) IMPLANT
SUT VIC AB 3-0 X1 27 (SUTURE) IMPLANT
SYSTEM SAHARA CHEST DRAIN ATS (WOUND CARE) ×3 IMPLANT
TAPE CLOTH SURG 4X10 WHT LF (GAUZE/BANDAGES/DRESSINGS) ×1 IMPLANT
TAPE PAPER 2X10 WHT MICROPORE (GAUZE/BANDAGES/DRESSINGS) ×1 IMPLANT
TOWEL GREEN STERILE (TOWEL DISPOSABLE) ×3 IMPLANT
TOWEL GREEN STERILE FF (TOWEL DISPOSABLE) ×3 IMPLANT
TRAY FOLEY SLVR 14FR TEMP STAT (SET/KITS/TRAYS/PACK) ×2 IMPLANT
TUBE CONNECTING 12X1/4 (SUCTIONS) ×1 IMPLANT
UNDERPAD 30X36 HEAVY ABSORB (UNDERPADS AND DIAPERS) ×3 IMPLANT
VALVE AORTIC KONECT RESILIA 27 (Valve) ×1 IMPLANT
VENT LEFT HEART 12002 (CATHETERS) ×3
WATER STERILE IRR 1000ML POUR (IV SOLUTION) ×6 IMPLANT
YANKAUER SUCT BULB TIP NO VENT (SUCTIONS) ×1 IMPLANT

## 2021-08-09 NOTE — Op Note (Signed)
CARDIOVASCULAR SURGERY OPERATIVE NOTE  08/09/2021  Surgeon:  [name], MD  First Assistant: Gershon Crane,  PA-C : An experienced assistant was required given the complexity of this surgery and the standard of surgical care. The assistant was needed for exposure, dissection, suctioning, retraction of delicate tissues and sutures, instrument exchange and for overall help during this procedure.    Preoperative Diagnosis:  Bicuspid aortic valve stenosis and 5.5 cm ascending aortic aneurysm.   Postoperative Diagnosis:  Same   Procedure:  Median Sternotomy Extracorporeal circulation 3.   Replacement of the ascending aorta (hemi-arch) using a 30 mm Hemashield graft under deep hypothermic circulatory arrest 4.   Bentall Procedure using a 27 mm Edwards KONECT Resilia pericardial valved graft.   Anesthesia:  General Endotracheal   Clinical History/Surgical Indication:    This 51 year old gentleman has a bicuspid aortic valve with moderate stenosis and a 5.3 to 5.4 cm fusiform ascending aortic aneurysm extending from the sinotubular junction up to the proximal aortic arch.  His aortic root is mildly dilated at 4.1 cm.  He does report some exertional fatigue over the past few months.  I agree that aortic valve replacement and replacement of his ascending aortic aneurysm is indicated to prevent the complications of aortic dissection and progressive aortic stenosis.  His aortic root is only 4.1 cm but his left coronary artery has a high takeoff essentially at the sinotubular junction and I do not think an anastomosis can be done in a supra coronary location.  I think the safest option is to replace his aortic root with a Bentall procedure.  Replacement of his ascending aorta would require circulatory arrest.  I discussed the alternatives of mechanical and bioprosthetic valves and the pros and cons of both.  He is not interested in taking Coumadin and would rather use a bioprosthetic valve which I  think is a reasonable choice at his age given the current generation of bovine pericardial valves with expected durability of 20+ years.     I reviewed the CTA images with the patient and his wife and answered all their questions.  I discussed the operative procedure with the patient and his wife including alternatives, benefits and risks; including but not limited to bleeding, blood transfusion, infection, stroke, myocardial infarction, graft failure, heart block requiring a permanent pacemaker, organ dysfunction, and death.  Josefa Half understands and agrees to proceed.      Preparation:  The patient was seen in the preoperative holding area and the correct patient, correct operation were confirmed with the patient after reviewing the medical record and catheterization. The consent was signed by me. Preoperative antibiotics were given. A pulmonary arterial line and radial arterial line were placed by the anesthesia team. The patient was taken back to the operating room and positioned supine on the operating room table. After being placed under general endotracheal anesthesia by the anesthesia team a foley catheter was placed. The neck, chest, abdomen, and both legs were prepped with betadine soap and solution and draped in the usual sterile manner. A surgical time-out was taken and the correct patient and operative procedure were confirmed with the nursing and anesthesia staff.   *INTRAOPERATIVE TRANSESOPHAGEAL REPORT *       Patient Name:   Johnny Henderson Date of Exam: 08/09/2021  Medical Rec #:  [id number]     Height:       72.0 in  Accession #:    [id number]    Weight:  268.0 lb  Date of Birth:  1970-06-10     BSA:          2.41 m  Patient Age:    51 years      BP:           126/91 mmHg  Patient Gender: M             HR:           72 bpm.  Exam Location:  Inpatient   Transesophogeal exam was perform intraoperatively during surgical  procedure.  Patient was closely monitored under  general anesthesia during the entirety  of  examination.   Indications:     Q23.1 Bicuspid aortic valve; I71.2 Ascending aortic  aneurysm  Sonographer:     Roseanna Rainbow RDCS  Performing Phys: [id number]  Diagnosing Phys: Suella Broad MD   Complications: No known complications during this procedure.   POST-OP IMPRESSIONS  s/p Bentall Procedure  _ Left Ventricle: LVEF unchanged, CO > 3, CI > 1.5. No significant RWMA's.  _ Right Ventricle: The right ventricle appears unchanged from pre-bypass.  _ Aorta: s/p ascending aneurysm repair using 14mm hemashield vascular  graft.  _ Left Atrium: The left atrium appears unchanged from pre-bypass.  _ Left Atrial Appendage: The left atrial appendage appears unchanged from  pre-bypass.  _ Aortic Valve: s/p aortic valve replacement using 65mm resilia aortic  valve.  _ Mitral Valve: The mitral valve appears unchanged from pre-bypass.  _ Tricuspid Valve: The tricuspid valve appears unchanged from pre-bypass.  _ Pulmonic Valve: The pulmonic valve appears unchanged from pre-bypass.  _ Interatrial Septum: The interatrial septum appears unchanged from  pre-bypass.  _ Interventricular Septum: The interventricular septum appears unchanged  from  pre-bypass.  _ Pericardium: The pericardium appears unchanged from pre-bypass.   PRE-OP FINDINGS   Left Ventricle: The left ventricle has low normal systolic function, with  an ejection fraction of 50-55%. The cavity size was normal. No evidence of  left ventricular regional wall motion abnormalities. There is no left  ventricular hypertrophy. Left  ventricular diastolic function could not be evaluated.   Right Ventricle: The right ventricle has normal systolic function. The  cavity was normal. There is no increase in right ventricular wall  thickness. There is no aneurysm seen. PA catheter traversing the right  ventricle into the main pulmonary artery.   Left Atrium: Left atrial size was normal in  size. No left atrial/left  atrial appendage thrombus was detected. The left atrial appendage is well  visualized and there is no evidence of thrombus present. Left atrial  appendage velocity is normal at greater  than 40 cm/s.   Right Atrium: Right atrial size was normal in size. PA catheter traversing  the right atrium into the right ventricle.   Interatrial Septum: No atrial level shunt detected by color flow Doppler.  There is no evidence of a patent foramen ovale.   Pericardium: There is no evidence of pericardial effusion. There is no  pleural effusion.   Mitral Valve: The mitral valve is normal in structure. Mitral valve  regurgitation is trivial by color flow Doppler. The MR jet is  centrally-directed. There is no evidence of mitral valve vegetation. There  is no evidence of mitral stenosis.   Tricuspid Valve: The tricuspid valve was normal in structure. Tricuspid  valve regurgitation is trivial by color flow Doppler. The jet is directed  centrally. No evidence of tricuspid stenosis is present. There is no  evidence of tricuspid valve vegetation.   Aortic Valve: The aortic valve is bicuspid with thickened leaflets but  good leaflet motion. Possible fusion of the left coronary and right  coronary leaflet. No regurgitation was not visualized by color flow  Doppler. There is no stenosis of the aortic  valve, mean gradient of 12 mmHg. There is no evidence of aortic valve  vegetation.   Pulmonic Valve: The pulmonic valve was normal in structure, with normal.  Pulmonic valve regurgitation is not visualized by color flow Doppler.    Aorta: The aortic arch are normal in size and structure. There is  dilatation of the aortic root, measuring 38 mm. There is an aneurysm  involving the ascending aorta measuring 55 mm. There is evidence of  immobile plaque in the descending aorta; Grade I,  measuring 1-43mm in size.   Pulmonary Artery: Gordy Councilman catheter present on the right. The  pulmonary  artery is of normal size.   Shunts: There is no evidence of an atrial septal defect.   +--------------+-------++  LEFT VENTRICLE         +--------------+-------++  PLAX 2D                +--------------+-------++  LVIDd:        4.60 cm  +--------------+-------++  LVIDs:        3.35 cm  +--------------+-------++  LV SV:        52 ml    +--------------+-------++  LV SV Index:  20.39    +--------------+-------++                         +--------------+-------++   +------------------+------------++  AORTIC VALVE                    +------------------+------------++  AV Vmax:          184.67 cm/s   +------------------+------------++  AV Vmean:         122.833 cm/s  +------------------+------------++  AV VTI:           0.394 m       +------------------+------------++  AV Peak Grad:     13.6 mmHg     +------------------+------------++  AV Mean Grad:     7.7 mmHg      +------------------+------------++  LVOT Vmax:        82.60 cm/s    +------------------+------------++  LVOT Vmean:       52.400 cm/s   +------------------+------------++  LVOT VTI:         0.197 m       +------------------+------------++  LVOT/AV VTI ratio:0.50          +------------------+------------++      +-------------+------+  SHUNTS               +-------------+------+  Systemic VTI:0.20 m  +-------------+------+      Suella Broad MD  Electronically signed by Suella Broad MD  Signature Date/Time: 08/09/2021/4:22:02 PM         Final     Cardiopulmonary Bypass:  A median sternotomy was performed. The pericardium was opened in the midline. Right ventricular function appeared normal. The ascending aorta was of normal size and had no palpable plaque. There were no contraindications to aortic cannulation or cross-clamping. The patient was fully systemically heparinized and the ACT was maintained > 400 sec. The  distal ascending aorta was cannulated with a 58 F aortic cannula for arterial inflow. Venous cannulation was performed via  the right atrial appendage using a two-staged venous cannula.  A temperature probe was inserted into the interventricular septum and an insulating pad was placed in the pericardium. CO2 was insufflated into the pericardium throughout the case to minimize intracardiac air.    Resection and grafting of ascending aortic aneurysm:  The patient was placed on cardiopulmonary bypass and a left ventricular vent was placed via the right superior pulmonary vein. Systemic cooling was begun with a goal temperature of 22 degrees centigrade by bladder and rectal temperature probes. A retrograde cardioplegia cannula was placed through the right atrium into the coronary sinus without difficulty. A retrograde cerebral perfusion cannula was placed into the SVC through a pursestring suture and the SVC was encircled with a silastic tape. After 30 minutes of cooling the target temperature of 22 degrees centigrade was reached. Cerebral oximetry was 70% bilaterally. BIS was zero. The patient was given Propofol and 125 mg of Solumedrol. The head was packed in ice. The bed was placed in steep trendelenburg. Circulatory arrest was begun and the blood volume emptied into the venous reservoir. 1800 cc of cold retrograde KBC cardioplegia was given and diastolic arrest achieved. Additional doses of cold KBC retrograde cardioplegia were given every 60 minutes. Continuous retrograde cerebral perfusion was begun and the SVC occluded with the silastic tape.  The aortic cannula was removed. The aorta was transected just proximal to the innominate artery beveling the resection out along the undersurface of the aortic arch (Hemiarch replacement). The aortic diameter was measured at 30 mm here. A 30 x 10 mm Hemasheild Platinum vascular graft was prepared.  It was anastomosed to the aortic arch in an end to end manner using 3-0  prolene continuous suture with a felt strip to reinforce the anastomisis. A light coating of CoSeal was applied to seal needle holes. The arterial end of the bypass circuit was then connected to the 36mm side arm graft and circulation was slowly resumed. The tape was removed from the SVC. The aortic graft was cross-clamped proximal to the side arm graft and full CPB support was resumed. Circulatory arrest time was 21 minutes. Retrograde cerebral perfusion time was 11 minutes.   Bentall Procedure:   The ascending aorta was mobilized from the right pulmonary artery and main PA. It was opened longitudinally and the valve inspected. It was a type 0 bicuspid valve with 2 commissures. The leaflets were markedly thickened and the posterior leaflet was calcified. The right and left coronary arteries were removed from the aortic root with a button of aortic wall around the ostia. The right coronary ostia was immediately adjacent to the annulus. They were retracted carefully out of the way with stay sutures to prevent rotation. The native valve was excised taking care to remove all particulate debri. The annulus was decalcified with rongeurs. The annulus was sized and a 27 mm Edwards KONECT Resilia pericardial valved graft was chosen. ( Model # [id number], Serial # [id number]). A series of pledgetted 2-0 Ethibond horizontal mattress sutures were placed around the annulus with the pledgets in a sub-annular position. The sutures were placed through the valve sewing ring. The valve was lowered into place and the sutures tied . The valve seated nicely.  Small openings were made in the graft for the coronary anastomoses using a thermal cautery. Then the left and right coronary buttons were anastomosed to the graft in an end to side manner using continuous 5-0 prolene suture. A light coating of CoSeal was applied to each anastomosis  for hemostasis. The two grafts were then cut to the appropriate length and anastomosed end to end  using continuous 3-0 prolene suture. CoSeal was applied to seal the needle holes in the grafts. A vent cannula was placed into the graft to remove any air. Deairing maneuvers were performed and the bed placed in trendelenburg position.   Completion:   The patient was rewarmed to 37 degrees Centigrade. The crossclamp was removed with a time of 179 minutes. There was spontaneous return of sinus rhythm. The position of the grafts was satisfactory. The vascular anastomoses all appeared hemostatic. Two temporary epicardial pacing wires were placed on the right atrium and two on the right ventricle. The patient was weaned from CPB without difficulty on no inotropic agents.  CPB time was 280 minutes. Cardiac output was 5 LPM. TEE showed a normal functioning aortic valve prosthesis with no AI. LV function appeared normal. Heparin was fully reversed with protamine and the aortic and venous cannulas removed. There was some oozing from needle holes at the annulus that I could not reach to suture and they would not stop oozing so I gave a half dose of NovoSeven. The needle hole bleeding stopped. Hemostasis was achieved. Mediastinal drainage tubes were placed. The sternum was closed with double #6 stainless steel wires. The fascia was closed with continuous # 1 vicryl suture. The subcutaneous tissue was closed with 2-0 vicryl continuous suture. The skin was closed with 3-0 vicryl subcuticular suture. All sponge, needle, and instrument counts were reported correct at the end of the case. Dry sterile dressings were placed over the incisions and around the chest tubes which were connected to pleurevac suction. The patient was then transported to the surgical intensive care unit in stable condition.

## 2021-08-09 NOTE — Interval H&P Note (Signed)
History and Physical Interval Note:  08/09/2021 7:12 AM  Johnny Henderson  has presented today for surgery, with the diagnosis of TAA AS.  The various methods of treatment have been discussed with the patient and family. After consideration of risks, benefits and other options for treatment, the patient has consented to  Procedure(s) with comments: BENTALL PROCEDURE (N/A) - CIRC ARREST REPLACEMENT OF ASCENDING AORTIC ANEURYSM (N/A) TRANSESOPHAGEAL ECHOCARDIOGRAM (TEE) (N/A) as a surgical intervention.  The patient's history has been reviewed, patient examined, no change in status, stable for surgery.  I have reviewed the patient's chart and labs.  Questions were answered to the patient's satisfaction.     [name]

## 2021-08-09 NOTE — Transfer of Care (Signed)
Immediate Anesthesia Transfer of Care Note  Patient: Johnny Henderson  Procedure(s) Performed: BENTALL PROCEDURE USING Konect RESILIA  AORTIC VALVED CONDUIT (Chest) REPLACEMENT OF ASCENDING AORTIC ANEURYSM USING HEMASHIELD PLATINUM VASCULAR GRAFT (Chest) TRANSESOPHAGEAL ECHOCARDIOGRAM (TEE)  Patient Location: ICU  Anesthesia Type:General  Level of Consciousness: sedated and Patient remains intubated per anesthesia plan  Airway & Oxygen Therapy: Patient remains intubated per anesthesia plan and Patient placed on Ventilator (see vital sign flow sheet for setting)  Post-op Assessment: Report given to RN and Post -op Vital signs reviewed and stable  Post vital signs: Reviewed and stable  Last Vitals:  Vitals Value Taken Time  BP 87/68 08/09/21 1630  Temp 37.4 C 08/09/21 1640  Pulse 78 08/09/21 1640  Resp 20 08/09/21 1640  SpO2 94 % 08/09/21 1640  Vitals shown include unvalidated device data.  Last Pain:  Vitals:   08/09/21 0622  TempSrc:   PainSc: 0-No pain         Complications: No notable events documented.

## 2021-08-09 NOTE — Anesthesia Procedure Notes (Signed)
Central Venous Catheter Insertion Performed by: Effie Berkshire, MD, anesthesiologist Start/End6/04/2021 6:45 AM, 08/09/2021 6:55 AM Patient location: Pre-op. Preanesthetic checklist: patient identified, IV checked, site marked, risks and benefits discussed, surgical consent, monitors and equipment checked, pre-op evaluation, timeout performed and anesthesia consent Position: Trendelenburg Lidocaine 1% used for infiltration and patient sedated Hand hygiene performed , maximum sterile barriers used  and Seldinger technique used Catheter size: 9 Fr Total catheter length 10. Central line was placed.MAC introducer Swan type:thermodilution PA Cath depth:50 Procedure performed using ultrasound guided technique. Ultrasound Notes:anatomy identified, needle tip was noted to be adjacent to the nerve/plexus identified, no ultrasound evidence of intravascular and/or intraneural injection and image(s) printed for medical record Attempts: 1 Following insertion, line sutured, dressing applied and Biopatch. Post procedure assessment: blood return through all ports, free fluid flow and no air  Patient tolerated the procedure well with no immediate complications.

## 2021-08-09 NOTE — Progress Notes (Signed)
  Echocardiogram Echocardiogram Transesophageal has been performed.  Johnny Henderson 08/09/2021, 8:37 AM

## 2021-08-09 NOTE — Anesthesia Procedure Notes (Signed)
Arterial Line Insertion Start/End6/04/2021 6:50 AM Performed by: Carolan Clines, CRNA, CRNA  Patient location: Pre-op. Preanesthetic checklist: patient identified, IV checked, site marked, risks and benefits discussed, surgical consent, monitors and equipment checked, pre-op evaluation, timeout performed and anesthesia consent Lidocaine 1% used for infiltration Left, radial was placed Catheter size: 20 G Hand hygiene performed  and maximum sterile barriers used   Attempts: 1 Procedure performed without using ultrasound guided technique. Following insertion, dressing applied and Biopatch. Post procedure assessment: normal and unchanged  Patient tolerated the procedure well with no immediate complications.

## 2021-08-09 NOTE — Progress Notes (Signed)
Patient ID: Johnny Henderson, male   DOB: 1970/07/23, 51 y.o.   MRN: [id number]  TCTS Evening Rounds:   Hemodynamically stable now but had sudden episode of profound hypotension with drop in filling pressures to low level that did not improve with neo, NE and volume. He also became diffusely red and I suspected an allergic reaction to something although he did not received anything around that time since he got back from the OR. We gave him 1/4 amp epi and 50 mg Benadryl and BP came back up. Redness has improved.   Oxygen requirement has also increased postop and now on 100% and PEEP 10 with sats improved from 85% to 97%. Repeat CXR pending.  Urine output good  CT output low  CBC    Component Value Date/Time   WBC 20.3 (H) 08/09/2021 0534   RBC 4.76 08/09/2021 0534   HGB 10.9 (L) 08/09/2021 1409   HCT 32.0 (L) 08/09/2021 1409   PLT 157 08/09/2021 1145   MCV 96.8 08/09/2021 0534   MCH 32.6 08/09/2021 0534   MCHC 33.6 08/09/2021 0534   RDW 12.5 08/09/2021 0534     BMET    Component Value Date/Time   NA 139 08/09/2021 1409   NA 137 05/10/2021 1037   K 3.9 08/09/2021 1409   CL 102 08/09/2021 1406   CO2 22 08/07/2021 1114   GLUCOSE 150 (H) 08/09/2021 1406   BUN 17 08/09/2021 1406   BUN 14 05/10/2021 1037   CREATININE 1.00 08/09/2021 1406   CALCIUM 9.1 08/07/2021 1114   EGFR 76 05/10/2021 1037   GFRNONAA >60 08/07/2021 1114     A/P:  Will plan to keep intubated overnight.

## 2021-08-09 NOTE — Anesthesia Procedure Notes (Signed)
Procedure Name: Intubation Date/Time: 08/09/2021 7:48 AM Performed by: Carolan Clines, CRNA Pre-anesthesia Checklist: Patient identified, Emergency Drugs available, Suction available and Patient being monitored Patient Re-evaluated:Patient Re-evaluated prior to induction Oxygen Delivery Method: Circle System Utilized Preoxygenation: Pre-oxygenation with 100% oxygen Induction Type: IV induction Ventilation: Oral airway inserted - appropriate to patient size and Two handed mask ventilation required Laryngoscope Size: Mac and 4 Grade View: Grade II Tube type: Oral Tube size: 8.0 mm Number of attempts: 1 Airway Equipment and Method: Stylet and Oral airway Placement Confirmation: ETT inserted through vocal cords under direct vision, positive ETCO2 and breath sounds checked- equal and bilateral Secured at: 22 cm Tube secured with: Tape Dental Injury: Teeth and Oropharynx as per pre-operative assessment

## 2021-08-09 NOTE — Hospital Course (Addendum)
  HPI:   The patient is a 51 year old gentleman with a history of hypertension and bicuspid aortic valve diagnosed in childhood who had been lost to follow-up and then underwent a 2D echocardiogram at [facility] in January 2023.  This showed a bicuspid aortic valve with some thickening and calcification.  The mean gradient was 28 mmHg with moderate aortic stenosis.  There is also a dilated ascending aorta measured at 4.8 cm.  The patient was referred to Dr. Angelena Form and underwent a CTA of the chest on 05/17/2021.  This showed the aortic root to measure 4.0 to 4.2 cm at the level of the sinuses of Valsalva.  The aortic valve was thickened and calcified.  The ascending aorta had a diameter of 5.2 cm.  Proximal aortic arch measured 4.0 cm and the distal arch 2.5 cm.  The descending thoracic aorta measured 2.6 cm.  There is normal arch branch anatomy.  The patient was referred to Dr. Kipp Brood in our office and plans were made for aortic valve replacement and replacement of the ascending aorta.  I had recently operated on 2 church acquaintances of the patient and he requested see me as a second opinion.   On 07/11/2021 he presented to the emergency room after developing some chest and back discomfort that he described as tightness and generally not feeling well.  He went to bed early but when he woke up he continued to have chest discomfort radiating up into his neck and down into his abdomen.  He denied any shortness of breath.  He had no numbness or weakness.  He underwent a CTA of the chest which ruled out aortic dissection but did show the ascending aortic aneurysm with a maximum diameter of 5.4 cm.   The patient denies any other chest discomfort.  In retrospect she reports some exertional fatigue particularly at the end of the day over the past few months.  He denies any dizziness or syncope.  He has had no peripheral edema.  Denies orthopnea.   He lives with his wife.  He has 3  children.  He has been checking his blood pressure at home and measurement of his systolic blood pressure is frequently in the 150-170 range which is new for him.     He underwent cath by Dr. Angelena Form on 07/26/2021 which showed no coronary disease and a peak to peak gradient across the AV of 50 mm Hg with an LVEDP of 16-19.  The patient and all relevant studies were reviewed by Dr. Cyndia Bent who recommended repair of the aortic aneurysm as well as aortic valve replacement.  Hospital course the patient was admitted electively and taken to the OR and underwent a Bentall procedure using a 27 mm connect Resilia aortic valve conduit and 30 mm Hemashield platinum vascular graft.  He tolerated the procedure well was taken to the surgical intensive care unit in stable condition.  He remained hemodynamically stable.  He was weaned from the ventilator per protocol and extubated on the first postoperative day.  Chest tube drainage gradually tapered off allowing the tube to be removed on postop day 2.  Monitoring lines were removed.  He was mobilized routinely.  Diuresis was begun on postop day 2 for expected volume excess.

## 2021-08-09 NOTE — Brief Op Note (Signed)
08/09/2021  12:47 PM  PATIENT:  Johnny Henderson  51 y.o. male  PRE-OPERATIVE DIAGNOSIS:  Thoracic Aortic Aneurysm Aortic Stenosis  POST-OPERATIVE DIAGNOSIS:  Thoracic Aortic Aneurysm Aortic Stenosis  PROCEDURE:  Procedure(s) with comments: BENTALL PROCEDURE USING 27MM Konect RESILIA  AORTIC VALVED CONDUIT (N/A) - CIRC ARREST REPLACEMENT OF ASCENDING AORTIC ANEURYSM USING 30MM HEMASHIELD PLATINUM VASCULAR GRAFT (N/A) TRANSESOPHAGEAL ECHOCARDIOGRAM (TEE) (N/A)  SURGEON:  Surgeon(s) and Role:    * Shelden Raborn, Fernande Boyden, MD - Primary  PHYSICIAN ASSISTANT: WAYNE GOLD PA-C  ASSISTANTS: RNFA   ANESTHESIA:   general   BLOOD ADMINISTERED:CRYOPRECIPITATE  DRAINS:  MEDIASTINAL CHEST TUBES    LOCAL MEDICATIONS USED:  NONE  SPECIMEN:  Source of Specimen:  AORTIC ANEURYSM AND AORTIC VALVE LEAFLETS   DISPOSITION OF SPECIMEN:  PATHOLOGY  COUNTS:  YES  TOURNIQUET:  * No tourniquets in log *  DICTATION: .Dragon Dictation  PLAN OF CARE: Admit to inpatient   PATIENT DISPOSITION:  ICU - intubated and hemodynamically stable.   Delay start of Pharmacological VTE agent (>24hrs) due to surgical blood loss or risk of bleeding: yes  COMPLICATIONS: NO KNOWN

## 2021-08-09 NOTE — Anesthesia Procedure Notes (Signed)
Central Venous Catheter Insertion Performed by: Effie Berkshire, MD, anesthesiologist Start/End6/04/2021 6:55 AM, 08/09/2021 7:00 AM Patient location: Pre-op. Preanesthetic checklist: patient identified, IV checked, site marked, risks and benefits discussed, surgical consent, monitors and equipment checked, pre-op evaluation, timeout performed and anesthesia consent Hand hygiene performed  and maximum sterile barriers used  PA cath was placed.Swan type:thermodilution Procedure performed without using ultrasound guided technique. Attempts: 1 Patient tolerated the procedure well with no immediate complications.

## 2021-08-09 NOTE — Anesthesia Postprocedure Evaluation (Signed)
Anesthesia Post Note  Patient: Johnny Henderson  Procedure(s) Performed: BENTALL PROCEDURE USING 27MM Konect RESILIA  AORTIC VALVED CONDUIT (Chest) REPLACEMENT OF ASCENDING AORTIC ANEURYSM USING 30MM HEMASHIELD PLATINUM VASCULAR GRAFT (Chest) TRANSESOPHAGEAL ECHOCARDIOGRAM (TEE)     Patient location during evaluation: SICU Anesthesia Type: General Level of consciousness: sedated Pain management: pain level controlled Vital Signs Assessment: post-procedure vital signs reviewed and stable Respiratory status: patient remains intubated per anesthesia plan Cardiovascular status: stable Postop Assessment: no apparent nausea or vomiting Anesthetic complications: no   No notable events documented.            Effie Berkshire

## 2021-08-10 ENCOUNTER — Inpatient Hospital Stay (HOSPITAL_COMMUNITY): Payer: Managed Care, Other (non HMO)

## 2021-08-10 LAB — PREPARE CRYOPRECIPITATE
Unit division: 0
Unit division: 0

## 2021-08-10 LAB — POCT I-STAT 7, (LYTES, BLD GAS, ICA,H+H)
Acid-Base Excess: 0 mmol/L (ref 0.0–2.0)
Acid-base deficit: 2 mmol/L (ref 0.0–2.0)
Acid-base deficit: 3 mmol/L — ABNORMAL HIGH (ref 0.0–2.0)
Bicarbonate: 22.6 mmol/L (ref 20.0–28.0)
Bicarbonate: 21.2 mmol/L (ref 20.0–28.0)
Bicarbonate: 24.8 mmol/L (ref 20.0–28.0)
Calcium, Ion: 1.12 mmol/L — ABNORMAL LOW (ref 1.15–1.40)
Calcium, Ion: 1.09 mmol/L — ABNORMAL LOW (ref 1.15–1.40)
Calcium, Ion: 1.15 mmol/L (ref 1.15–1.40)
HCT: 43 % (ref 39.0–52.0)
HCT: 41 % (ref 39.0–52.0)
HCT: 44 % (ref 39.0–52.0)
Hemoglobin: 14.6 g/dL (ref 13.0–17.0)
Hemoglobin: 13.9 g/dL (ref 13.0–17.0)
Hemoglobin: 15 g/dL (ref 13.0–17.0)
O2 Saturation: 96 %
O2 Saturation: 97 %
O2 Saturation: 95 %
Patient temperature: 37.5
Patient temperature: 37.4
Patient temperature: 36.9
Potassium: 4.4 mmol/L (ref 3.5–5.1)
Potassium: 4.1 mmol/L (ref 3.5–5.1)
Potassium: 4.6 mmol/L (ref 3.5–5.1)
Sodium: 141 mmol/L (ref 135–145)
Sodium: 141 mmol/L (ref 135–145)
Sodium: 141 mmol/L (ref 135–145)
TCO2: 24 mmol/L (ref 22–32)
TCO2: 22 mmol/L (ref 22–32)
TCO2: 26 mmol/L (ref 22–32)
pCO2 arterial: 38.6 mmHg (ref 32–48)
pCO2 arterial: 36.3 mmHg (ref 32–48)
pCO2 arterial: 40.8 mmHg (ref 32–48)
pH, Arterial: 7.377 (ref 7.35–7.45)
pH, Arterial: 7.377 (ref 7.35–7.45)
pH, Arterial: 7.392 (ref 7.35–7.45)
pO2, Arterial: 83 mmHg (ref 83–108)
pO2, Arterial: 91 mmHg (ref 83–108)
pO2, Arterial: 78 mmHg — ABNORMAL LOW (ref 83–108)

## 2021-08-10 LAB — GLUCOSE, CAPILLARY
Glucose-Capillary: 101 mg/dL — ABNORMAL HIGH (ref 70–99)
Glucose-Capillary: 104 mg/dL — ABNORMAL HIGH (ref 70–99)
Glucose-Capillary: 109 mg/dL — ABNORMAL HIGH (ref 70–99)
Glucose-Capillary: 117 mg/dL — ABNORMAL HIGH (ref 70–99)
Glucose-Capillary: 117 mg/dL — ABNORMAL HIGH (ref 70–99)
Glucose-Capillary: 117 mg/dL — ABNORMAL HIGH (ref 70–99)
Glucose-Capillary: 119 mg/dL — ABNORMAL HIGH (ref 70–99)
Glucose-Capillary: 119 mg/dL — ABNORMAL HIGH (ref 70–99)
Glucose-Capillary: 120 mg/dL — ABNORMAL HIGH (ref 70–99)
Glucose-Capillary: 131 mg/dL — ABNORMAL HIGH (ref 70–99)
Glucose-Capillary: 136 mg/dL — ABNORMAL HIGH (ref 70–99)
Glucose-Capillary: 138 mg/dL — ABNORMAL HIGH (ref 70–99)
Glucose-Capillary: 146 mg/dL — ABNORMAL HIGH (ref 70–99)

## 2021-08-10 LAB — CBC
HCT: 42.7 % (ref 39.0–52.0)
HCT: 40 % (ref 39.0–52.0)
Hemoglobin: 14.6 g/dL (ref 13.0–17.0)
Hemoglobin: 13.8 g/dL (ref 13.0–17.0)
MCH: 32.7 pg (ref 26.0–34.0)
MCH: 33.3 pg (ref 26.0–34.0)
MCHC: 34.2 g/dL (ref 30.0–36.0)
MCHC: 34.5 g/dL (ref 30.0–36.0)
MCV: 95.7 fL (ref 80.0–100.0)
MCV: 96.4 fL (ref 80.0–100.0)
Platelets: 116 10*3/uL — ABNORMAL LOW (ref 150–400)
Platelets: 110 10*3/uL — ABNORMAL LOW (ref 150–400)
RBC: 4.46 MIL/uL (ref 4.22–5.81)
RBC: 4.15 MIL/uL — ABNORMAL LOW (ref 4.22–5.81)
RDW: 12.7 % (ref 11.5–15.5)
RDW: 13 % (ref 11.5–15.5)
WBC: 21.7 10*3/uL — ABNORMAL HIGH (ref 4.0–10.5)
WBC: 27.5 10*3/uL — ABNORMAL HIGH (ref 4.0–10.5)
nRBC: 0 % (ref 0.0–0.2)
nRBC: 0 % (ref 0.0–0.2)

## 2021-08-10 LAB — BASIC METABOLIC PANEL
Anion gap: 5 (ref 5–15)
Anion gap: 5 (ref 5–15)
BUN: 15 mg/dL (ref 6–20)
BUN: 14 mg/dL (ref 6–20)
CO2: 24 mmol/L (ref 22–32)
CO2: 24 mmol/L (ref 22–32)
Calcium: 7.7 mg/dL — ABNORMAL LOW (ref 8.9–10.3)
Calcium: 7.7 mg/dL — ABNORMAL LOW (ref 8.9–10.3)
Chloride: 108 mmol/L (ref 98–111)
Chloride: 106 mmol/L (ref 98–111)
Creatinine, Ser: 1.07 mg/dL (ref 0.61–1.24)
Creatinine, Ser: 0.92 mg/dL (ref 0.61–1.24)
GFR, Estimated: >60 mL/min (ref >60)
GFR, Estimated: >60 mL/min (ref >60)
Glucose, Bld: 150 mg/dL — ABNORMAL HIGH (ref 70–99)
Glucose, Bld: 129 mg/dL — ABNORMAL HIGH (ref 70–99)
Potassium: 4.7 mmol/L (ref 3.5–5.1)
Potassium: 4.4 mmol/L (ref 3.5–5.1)
Sodium: 137 mmol/L (ref 135–145)
Sodium: 135 mmol/L (ref 135–145)

## 2021-08-10 LAB — BPAM CRYOPRECIPITATE
Blood Product Expiration Date: 202306021817
Blood Product Expiration Date: 202306021817
ISSUE DATE / TIME: 202306021240
ISSUE DATE / TIME: 202306021240
Unit Type and Rh: 5100
Unit Type and Rh: 5100

## 2021-08-10 LAB — MAGNESIUM
Magnesium: 2.5 mg/dL — ABNORMAL HIGH (ref 1.7–2.4)
Magnesium: 2.2 mg/dL (ref 1.7–2.4)

## 2021-08-10 MED ORDER — ENOXAPARIN SODIUM 40 MG/0.4ML IJ SOSY
40.0000 mg | PREFILLED_SYRINGE | Freq: Every day | INTRAMUSCULAR | Status: DC
  Administered 2021-08-10 – 2021-08-14 (×5): 40 mg via SUBCUTANEOUS
  Filled 2021-08-10 (×5): qty 0.4

## 2021-08-10 MED ORDER — INSULIN DETEMIR 100 UNIT/ML ~~LOC~~ SOLN
15.0000 [IU] | Freq: Once | SUBCUTANEOUS | Status: AC
Start: 2021-08-10 — End: 2021-08-10
  Administered 2021-08-10: 15 [IU] via SUBCUTANEOUS
  Filled 2021-08-10: qty 0.15

## 2021-08-10 MED ORDER — INSULIN ASPART 100 UNIT/ML IJ SOLN: 0.0000 [IU] | INTRAMUSCULAR | Status: DC

## 2021-08-10 MED ORDER — ORAL CARE MOUTH RINSE
15.0000 mL | Freq: Two times a day (BID) | OROMUCOSAL | Status: DC
  Administered 2021-08-10 – 2021-08-13 (×6): 15 mL via OROMUCOSAL

## 2021-08-10 NOTE — Progress Notes (Signed)
1 Day Post-Op Procedure(s) (LRB): BENTALL PROCEDURE USING Konect RESILIA  AORTIC VALVED CONDUIT (N/A) REPLACEMENT OF ASCENDING AORTIC ANEURYSM USING HEMASHIELD PLATINUM VASCULAR GRAFT (N/A) TRANSESOPHAGEAL ECHOCARDIOGRAM (TEE) (N/A) Subjective:  Intubated on vent but oxygenation improved and now on 50% and 6 PEEP.  Has been hemodynamically stable overnight.  Objective: Vital signs in last 24 hours: Temp:  [97.2 F (36.2 C)-100.4 F (38 C)] 99.1 F (37.3 C) (06/03 0708) Pulse Rate:  [67-120] 71 (06/03 0730) Cardiac Rhythm: Normal sinus rhythm (06/02 2000) Resp:  [12-32] 16 (06/03 0730) BP: (66-144)/(47-102) 116/73 (06/03 0700) SpO2:  [85 %-98 %] 95 % (06/03 0730) Arterial Line BP: (52-124)/(36-74) 105/55 (06/03 0730) FiO2 (%):  [50 %-100 %] 50 % (06/03 0708) Weight:  [125.6 kg] 125.6 kg (06/03 0500)  Hemodynamic parameters for last 24 hours: PAP: (23-38)/(12-27) 23/13 CO:  [3.2 L/min-5.5 L/min] 5.1 L/min CI:  [1.3 L/min/m2-2.4 L/min/m2] 2.2 L/min/m2  Intake/Output from previous day: 06/02 0701 - 06/03 0700 In: 6729.1 [I.V.:3332.9; Blood:2752; IV Piggyback:644.2] Out: 7672 [CNOBS:9628; Blood:3333; Chest Tube:280] Intake/Output this shift: No intake/output data recorded.  General appearance: alert and cooperative Neurologic: intact Heart: regular rate and rhythm, S1, S2 normal, no murmur Lungs: clear to auscultation bilaterally Extremities: edema mild Wound: dressing dry  Lab Results: Recent Labs    08/09/21 2019 08/10/21 0204 08/10/21 0512 08/10/21 0711  WBC 19.2* 21.7*  --   --   HGB 15.0 14.6 14.6 13.9  HCT 42.9 42.7 43.0 41.0  PLT 101* 116*  --   --    BMET:  Recent Labs    08/07/21 1114 08/09/21 0808 08/09/21 1406 08/09/21 1409 08/10/21 0204 08/10/21 0512 08/10/21 0711  NA 136   < > 139   < > 137 141 141  K 4.1   < > 3.9   < > 4.7 4.4 4.1  CL 107   < > 102  --  108  --   --   CO2 22  --   --   --  24  --   --   GLUCOSE 91   < > 150*   --  150*  --   --   BUN 11   < > 17  --  15  --   --   CREATININE 0.93   < > 1.00  --  1.07  --   --   CALCIUM 9.1  --   --   --  7.7*  --   --    < > = values in this interval not displayed.    PT/INR:  Recent Labs    08/09/21 2019  LABPROT 12.0  INR 0.9   ABG    Component Value Date/Time   PHART 7.377 08/10/2021 0711   HCO3 21.2 08/10/2021 0711   TCO2 22 08/10/2021 0711   ACIDBASEDEF 3.0 (H) 08/10/2021 0711   O2SAT 97 08/10/2021 0711   CBG (last 3)  Recent Labs    08/10/21 0306 08/10/21 0509 08/10/21 0708  GLUCAP 136* 109* 120*   CXR: mild LLL atelectasis  ECG: sinus, no acute changes or heart block.  Assessment/Plan: S/P Procedure(s) (LRB): BENTALL PROCEDURE USING Konect RESILIA  AORTIC VALVED CONDUIT (N/A) REPLACEMENT OF ASCENDING AORTIC ANEURYSM USING HEMASHIELD PLATINUM VASCULAR GRAFT (N/A) TRANSESOPHAGEAL ECHOCARDIOGRAM (TEE) (N/A)  POD 1  Hemodynamically stable on neo 42 mcg.  Postop hypoxemia due to atelectasis. Improved overnight. Plan to wean vent and extubate this am. Will need to work on IS.  UO good. Plan to diurese once extubated and neo decreased. Wt is 9 lbs over preop.  Glucose under good control. He received steroid dose for circ arrest. Will give one dose of levemir this am and transition to SSI.  Keep chest tubes for now.    LOS: 1 day    [name] 08/10/2021

## 2021-08-10 NOTE — Progress Notes (Signed)
Patient ID: Johnny Henderson, male   DOB: 1971-02-05, 51 y.o.   MRN: [id number] TCTS Evening Rounds:  Hemodynamically stable in sinus rhythm. Weaning neo today.  Urine output ok  Chest tube output low. Will remove in am.  BMET    Component Value Date/Time   NA 135 08/10/2021 1558   NA 137 05/10/2021 1037   K 4.4 08/10/2021 1558   CL 106 08/10/2021 1558   CO2 24 08/10/2021 1558   GLUCOSE 129 (H) 08/10/2021 1558   BUN 14 08/10/2021 1558   BUN 14 05/10/2021 1037   CREATININE 0.92 08/10/2021 1558   CALCIUM 7.7 (L) 08/10/2021 1558   EGFR 76 05/10/2021 1037   GFRNONAA >60 08/10/2021 1558   CBC    Component Value Date/Time   WBC 27.5 (H) 08/10/2021 1558   RBC 4.15 (L) 08/10/2021 1558   HGB 13.8 08/10/2021 1558   HCT 40.0 08/10/2021 1558   PLT 110 (L) 08/10/2021 1558   MCV 96.4 08/10/2021 1558   MCH 33.3 08/10/2021 1558   MCHC 34.5 08/10/2021 1558   RDW 13.0 08/10/2021 1558

## 2021-08-10 NOTE — Progress Notes (Signed)
Pt able to swallow liquids without coughing.

## 2021-08-10 NOTE — Procedures (Signed)
Extubation Procedure Note  Patient Details:   Name: Johnny Henderson DOB: January 13, 1971 MRN: [id number]   Airway Documentation:    Vent end date: 08/10/21 Vent end time: 1040   Evaluation  O2 sats: stable throughout Complications: No apparent complications Patient did tolerate procedure well. Bilateral Breath Sounds: Clear, Diminished   Yes, pt could speak post extubation.  Pt extubated to 4 l/m [place] per protocol.  Audrie Lia 08/10/2021, 10:41 AM

## 2021-08-11 ENCOUNTER — Inpatient Hospital Stay (HOSPITAL_COMMUNITY): Payer: Managed Care, Other (non HMO)

## 2021-08-11 LAB — CBC
HCT: 39.7 % (ref 39.0–52.0)
Hemoglobin: 13.6 g/dL (ref 13.0–17.0)
MCH: 33.4 pg (ref 26.0–34.0)
MCHC: 34.3 g/dL (ref 30.0–36.0)
MCV: 97.5 fL (ref 80.0–100.0)
Platelets: 109 10*3/uL — ABNORMAL LOW (ref 150–400)
RBC: 4.07 MIL/uL — ABNORMAL LOW (ref 4.22–5.81)
RDW: 13.2 % (ref 11.5–15.5)
WBC: 31.9 10*3/uL — ABNORMAL HIGH (ref 4.0–10.5)
nRBC: 0 % (ref 0.0–0.2)

## 2021-08-11 LAB — POCT I-STAT 7, (LYTES, BLD GAS, ICA,H+H)
Acid-base deficit: 1 mmol/L (ref 0.0–2.0)
Bicarbonate: 22.9 mmol/L (ref 20.0–28.0)
Calcium, Ion: 1.12 mmol/L — ABNORMAL LOW (ref 1.15–1.40)
HCT: 40 % (ref 39.0–52.0)
Hemoglobin: 13.6 g/dL (ref 13.0–17.0)
O2 Saturation: 96 %
Patient temperature: 37.2
Potassium: 4.3 mmol/L (ref 3.5–5.1)
Sodium: 140 mmol/L (ref 135–145)
TCO2: 24 mmol/L (ref 22–32)
pCO2 arterial: 36.9 mmHg (ref 32–48)
pH, Arterial: 7.402 (ref 7.35–7.45)
pO2, Arterial: 82 mmHg — ABNORMAL LOW (ref 83–108)

## 2021-08-11 LAB — BASIC METABOLIC PANEL
Anion gap: 6 (ref 5–15)
BUN: 16 mg/dL (ref 6–20)
CO2: 23 mmol/L (ref 22–32)
Calcium: 7.9 mg/dL — ABNORMAL LOW (ref 8.9–10.3)
Chloride: 104 mmol/L (ref 98–111)
Creatinine, Ser: 1.13 mg/dL (ref 0.61–1.24)
GFR, Estimated: >60 mL/min (ref >60)
Glucose, Bld: 156 mg/dL — ABNORMAL HIGH (ref 70–99)
Potassium: 4.7 mmol/L (ref 3.5–5.1)
Sodium: 133 mmol/L — ABNORMAL LOW (ref 135–145)

## 2021-08-11 LAB — GLUCOSE, CAPILLARY: Glucose-Capillary: 155 mg/dL — ABNORMAL HIGH (ref 70–99)

## 2021-08-11 MED ORDER — AMLODIPINE BESYLATE 5 MG PO TABS
5.0000 mg | ORAL_TABLET | Freq: Every day | ORAL | Status: DC
  Administered 2021-08-11 – 2021-08-13 (×3): 5 mg via ORAL
  Filled 2021-08-11 (×3): qty 1

## 2021-08-11 MED ORDER — METOPROLOL TARTRATE 12.5 MG HALF TABLET
12.5000 mg | ORAL_TABLET | Freq: Two times a day (BID) | ORAL | Status: DC
  Administered 2021-08-11: 12.5 mg via ORAL
  Filled 2021-08-11: qty 1

## 2021-08-11 MED ORDER — FUROSEMIDE 10 MG/ML IJ SOLN
40.0000 mg | Freq: Two times a day (BID) | INTRAMUSCULAR | Status: AC
  Administered 2021-08-11 (×2): 40 mg via INTRAVENOUS
  Filled 2021-08-11 (×2): qty 4

## 2021-08-11 MED ORDER — METOPROLOL TARTRATE 12.5 MG HALF TABLET
12.5000 mg | ORAL_TABLET | Freq: Once | ORAL | Status: AC
Start: 2021-08-11 — End: 2021-08-11
  Administered 2021-08-11: 12.5 mg via ORAL
  Filled 2021-08-11: qty 1

## 2021-08-11 MED ORDER — METOPROLOL TARTRATE 25 MG PO TABS
25.0000 mg | ORAL_TABLET | Freq: Two times a day (BID) | ORAL | Status: DC
  Administered 2021-08-11 – 2021-08-13 (×5): 25 mg via ORAL
  Filled 2021-08-11 (×5): qty 1

## 2021-08-11 MED ORDER — HYDRALAZINE HCL 20 MG/ML IJ SOLN
10.0000 mg | INTRAMUSCULAR | Status: DC | PRN
Start: 2021-08-11 — End: 2021-08-12
  Administered 2021-08-11 (×2): 10 mg via INTRAVENOUS
  Filled 2021-08-11 (×2): qty 1

## 2021-08-11 MED FILL — Heparin Sodium (Porcine) Inj 1000 Unit/ML: Qty: 1000 | Status: AC

## 2021-08-11 MED FILL — Lidocaine HCl Local Preservative Free (PF) Inj 2%: INTRAMUSCULAR | Qty: 15 | Status: AC

## 2021-08-11 MED FILL — Potassium Chloride Inj 2 mEq/ML: INTRAVENOUS | Qty: 40 | Status: AC

## 2021-08-11 MED FILL — Thrombin (Recombinant) For Soln 20000 Unit: CUTANEOUS | Qty: 1 | Status: AC

## 2021-08-11 NOTE — Progress Notes (Signed)
Patient ID: Johnny Henderson, male   DOB: [birth year], 51 y.o.   MRN: [id number]  TCTS Evening Rounds:  Hemodynamically stable with BP rising. Lopressor increased to 25 bid, prn hydralazine. Will resume Norvasc at 5 mg. He was on 10 mg preop.  Diuresing well.  Ambulated short distance this afternoon and was up in chair all day.

## 2021-08-11 NOTE — Progress Notes (Signed)
Dr Laneta Simmers notified of increase BP.  Orders received.

## 2021-08-11 NOTE — Progress Notes (Signed)
2 Days Post-Op Procedure(s) (LRB): BENTALL PROCEDURE USING Konect RESILIA  AORTIC VALVED CONDUIT (N/A) REPLACEMENT OF ASCENDING AORTIC ANEURYSM USING HEMASHIELD PLATINUM VASCULAR GRAFT (N/A) TRANSESOPHAGEAL ECHOCARDIOGRAM (TEE) (N/A) Subjective: Complains of chest wall pain. Sat up on side of bed this morning.  Objective: Vital signs in last 24 hours: Temp:  [97.9 F (36.6 C)-99.9 F (37.7 C)] 98.5 F (36.9 C) (06/04 0400) Pulse Rate:  [70-105] 94 (06/04 0700) Cardiac Rhythm: Normal sinus rhythm (06/03 2000) Resp:  [13-39] 21 (06/04 0700) BP: (107-143)/(55-89) 143/89 (06/04 0700) SpO2:  [88 %-97 %] 89 % (06/04 0700) Arterial Line BP: (95-158)/(48-65) 157/64 (06/04 0600) FiO2 (%):  [36 %-40 %] 36 % (06/03 1040) Weight:  [129.6 kg] 129.6 kg (06/04 0630)  Hemodynamic parameters for last 24 hours: PAP: (19-40)/(9-34) 36/17 CO:  [7 L/min] 7 L/min CI:  [3 L/min/m2] 3 L/min/m2  Intake/Output from previous day: 06/03 0701 - 06/04 0700 In: 1461.8 [P.O.:360; I.V.:852; IV Piggyback:249.9] Out: 1817 [Urine:1427; Chest Tube:390] Intake/Output this shift: No intake/output data recorded.  General appearance: alert and cooperative Neurologic: intact Heart: regular rate and rhythm, S1, S2 normal, no murmur Lungs: clear to auscultation bilaterally Extremities: edema mild Wound: incision ok  Lab Results: Recent Labs    08/10/21 1558 08/11/21 0414  WBC 27.5* 31.9*  HGB 13.8 13.6  HCT 40.0 39.7  PLT 110* 109*   BMET:  Recent Labs    08/10/21 1558 08/11/21 0414  NA 135 133*  K 4.4 4.7  CL 106 104  CO2 24 23  GLUCOSE 129* 156*  BUN 14 16  CREATININE 0.92 1.13  CALCIUM 7.7* 7.9*    PT/INR:  Recent Labs    08/09/21 2019  LABPROT 12.0  INR 0.9   ABG    Component Value Date/Time   PHART 7.402 08/10/2021 1205   HCO3 22.9 08/10/2021 1205   TCO2 24 08/10/2021 1205   ACIDBASEDEF 1.0 08/10/2021 1205   O2SAT 96 08/10/2021 1205   CBG (last 3)  Recent Labs     08/10/21 1203 08/10/21 1348 08/10/21 1601  GLUCAP 119* 117* 119*   CXR: ok  Assessment/Plan: S/P Procedure(s) (LRB): BENTALL PROCEDURE USING Konect RESILIA  AORTIC VALVED CONDUIT (N/A) REPLACEMENT OF ASCENDING AORTIC ANEURYSM USING HEMASHIELD PLATINUM VASCULAR GRAFT (N/A) TRANSESOPHAGEAL ECHOCARDIOGRAM (TEE) (N/A)  POD 2 Hemodynamically stable off pressors. Start low dose Lopressor.  Volume excess: wt is 18 lbs over preop. Start diuresis.  DC chest tubes, sleeve, foley  IS, ambulation.   LOS: 2 days    [name] 08/11/2021

## 2021-08-12 ENCOUNTER — Encounter (HOSPITAL_COMMUNITY): Payer: Self-pay | Admitting: Surgery

## 2021-08-12 ENCOUNTER — Inpatient Hospital Stay (HOSPITAL_COMMUNITY): Payer: Managed Care, Other (non HMO)

## 2021-08-12 LAB — BASIC METABOLIC PANEL
Anion gap: 9 (ref 5–15)
BUN: 23 mg/dL — ABNORMAL HIGH (ref 6–20)
CO2: 24 mmol/L (ref 22–32)
Calcium: 8.3 mg/dL — ABNORMAL LOW (ref 8.9–10.3)
Chloride: 100 mmol/L (ref 98–111)
Creatinine, Ser: 1.02 mg/dL (ref 0.61–1.24)
GFR, Estimated: >60 mL/min (ref >60)
Glucose, Bld: 143 mg/dL — ABNORMAL HIGH (ref 70–99)
Potassium: 4.3 mmol/L (ref 3.5–5.1)
Sodium: 133 mmol/L — ABNORMAL LOW (ref 135–145)

## 2021-08-12 LAB — CBC
HCT: 37.4 % — ABNORMAL LOW (ref 39.0–52.0)
Hemoglobin: 13.1 g/dL (ref 13.0–17.0)
MCH: 33.6 pg (ref 26.0–34.0)
MCHC: 35 g/dL (ref 30.0–36.0)
MCV: 95.9 fL (ref 80.0–100.0)
Platelets: 111 10*3/uL — ABNORMAL LOW (ref 150–400)
RBC: 3.9 MIL/uL — ABNORMAL LOW (ref 4.22–5.81)
RDW: 13.2 % (ref 11.5–15.5)
WBC: 29.2 10*3/uL — ABNORMAL HIGH (ref 4.0–10.5)
nRBC: 0 % (ref 0.0–0.2)

## 2021-08-12 LAB — SURGICAL PATHOLOGY

## 2021-08-12 MED ORDER — FUROSEMIDE 40 MG PO TABS
40.0000 mg | ORAL_TABLET | Freq: Every day | ORAL | Status: DC
  Administered 2021-08-13 – 2021-08-14 (×2): 40 mg via ORAL
  Filled 2021-08-12 (×2): qty 1

## 2021-08-12 MED ORDER — SODIUM CHLORIDE 0.9 % IV SOLN: 250.0000 mL | INTRAVENOUS | Status: DC | PRN

## 2021-08-12 MED ORDER — METOLAZONE 2.5 MG PO TABS
2.5000 mg | ORAL_TABLET | Freq: Once | ORAL | Status: AC
  Administered 2021-08-12: 2.5 mg via ORAL
  Filled 2021-08-12: qty 1

## 2021-08-12 MED ORDER — ~~LOC~~ CARDIAC SURGERY, PATIENT & FAMILY EDUCATION
Freq: Once | Status: AC
Start: 2021-08-12 — End: 2021-08-12

## 2021-08-12 MED ORDER — SENNOSIDES-DOCUSATE SODIUM 8.6-50 MG PO TABS: 1.0000 | ORAL_TABLET | Freq: Two times a day (BID) | ORAL | Status: DC | PRN

## 2021-08-12 MED ORDER — FUROSEMIDE 10 MG/ML IJ SOLN
40.0000 mg | Freq: Once | INTRAMUSCULAR | Status: AC
  Administered 2021-08-12: 40 mg via INTRAVENOUS
  Filled 2021-08-12: qty 4

## 2021-08-12 MED ORDER — POTASSIUM CHLORIDE CRYS ER 20 MEQ PO TBCR
20.0000 meq | EXTENDED_RELEASE_TABLET | Freq: Once | ORAL | Status: AC
  Administered 2021-08-12: 20 meq via ORAL
  Filled 2021-08-12: qty 1

## 2021-08-12 MED ORDER — SODIUM CHLORIDE 0.9% FLUSH: 3.0000 mL | INTRAVENOUS | Status: DC | PRN

## 2021-08-12 MED ORDER — ASPIRIN 81 MG PO TBEC
81.0000 mg | DELAYED_RELEASE_TABLET | Freq: Every day | ORAL | Status: DC
  Administered 2021-08-13 – 2021-08-15 (×3): 81 mg via ORAL
  Filled 2021-08-12 (×3): qty 1

## 2021-08-12 MED ORDER — SODIUM CHLORIDE 0.9% FLUSH
3.0000 mL | Freq: Two times a day (BID) | INTRAVENOUS | Status: DC
  Administered 2021-08-12 – 2021-08-14 (×3): 3 mL via INTRAVENOUS

## 2021-08-12 NOTE — Progress Notes (Signed)
  Transition of Care [facility]) Screening Note   Patient Details  Name: Johnny Henderson Date of Birth: Feb 29, 1971   Transition of Care [facility]) CM/SW Contact:    Delilah Shan, LCSWA Phone Number: 08/12/2021, 3:47 PM    Transition of Care Department [facility]) has reviewed patient and no TOC needs have been identified at this time. We will continue to monitor patient advancement through interdisciplinary progression rounds. If new patient transition needs arise, please place a TOC consult.

## 2021-08-12 NOTE — Progress Notes (Signed)
3 Days Post-Op Procedure(s) (LRB): BENTALL PROCEDURE USING Konect RESILIA  AORTIC VALVED CONDUIT (N/A) REPLACEMENT OF ASCENDING AORTIC ANEURYSM USING HEMASHIELD PLATINUM VASCULAR GRAFT (N/A) TRANSESOPHAGEAL ECHOCARDIOGRAM (TEE) (N/A) Subjective:  Feeling better. Ambulated a full lap this am. Requiring less pain medication.  Objective: Vital signs in last 24 hours: Temp:  [98 F (36.7 C)-98.8 F (37.1 C)] 98 F (36.7 C) (06/05 0000) Pulse Rate:  [73-101] 84 (06/05 0700) Cardiac Rhythm: Normal sinus rhythm;Sinus tachycardia (06/05 0000) Resp:  [8-27] 11 (06/05 0700) BP: (118-167)/(82-138) 118/87 (06/05 0700) SpO2:  [90 %-96 %] 92 % (06/05 0700) Weight:  [127.7 kg] 127.7 kg (06/05 0500)  Hemodynamic parameters for last 24 hours:    Intake/Output from previous day: 06/04 0701 - 06/05 0700 In: 603 [P.O.:600; I.V.:3] Out: 1950 [Urine:1950] Intake/Output this shift: No intake/output data recorded.  General appearance: alert and cooperative Neurologic: intact Heart: regular rate and rhythm, S1, S2 normal, no murmur, click, rub or gallop Lungs: clear to auscultation bilaterally Extremities: edema moderate Wound: dressing dry  Lab Results: Recent Labs    08/10/21 1558 08/11/21 0414  WBC 27.5* 31.9*  HGB 13.8 13.6  HCT 40.0 39.7  PLT 110* 109*   BMET:  Recent Labs    08/10/21 1558 08/11/21 0414  NA 135 133*  K 4.4 4.7  CL 106 104  CO2 24 23  GLUCOSE 129* 156*  BUN 14 16  CREATININE 0.92 1.13  CALCIUM 7.7* 7.9*    PT/INR:  Recent Labs    08/09/21 2019  LABPROT 12.0  INR 0.9   ABG    Component Value Date/Time   PHART 7.402 08/10/2021 1205   HCO3 22.9 08/10/2021 1205   TCO2 24 08/10/2021 1205   ACIDBASEDEF 1.0 08/10/2021 1205   O2SAT 96 08/10/2021 1205   CBG (last 3)  Recent Labs    08/10/21 1348 08/10/21 1601 08/11/21 1603  GLUCAP 117* 119* 155*   CXR stable. Good aeration.  Assessment/Plan: S/P Procedure(s) (LRB): BENTALL  PROCEDURE USING Konect RESILIA  AORTIC VALVED CONDUIT (N/A) REPLACEMENT OF ASCENDING AORTIC ANEURYSM USING HEMASHIELD PLATINUM VASCULAR GRAFT (N/A) TRANSESOPHAGEAL ECHOCARDIOGRAM (TEE) (N/A)  POD 3  Hemodynamically stable in sinus rhythm. Continue Lopressor and Norvasc.  Volume excess: -1300 cc yesterday. Wt down 4 lbs. Still 14 lbs over preop. Continue diuresis.   Follow up labs from this am. Not drawn yet.  Continue IS, ambulation.  Transfer to 4E.   LOS: 3 days    [name] 08/12/2021

## 2021-08-12 NOTE — Discharge Summary (Signed)
[address]       [place] [address]             [phone]    Physician Discharge Summary  Patient ID: Johnny Henderson MRN: [id number] DOB/AGE: 13-Mar-1970 51 y.o.  Admit date: 08/09/2021 Discharge date: 08/15/2021  Admission Diagnoses:  Patient Active Problem List   Diagnosis Date Noted   Aneurysm of ascending aorta without rupture (HCC)    Tear of meniscus of knee 12/12/2019   Non-recurrent bilateral inguinal hernia without obstruction or gangrene 11/30/2018   Closed fracture of metatarsal bone 04/17/2017   Hypogonadism in male 08/23/2014   Low serum testosterone 02/06/2014   Poison ivy 09/13/2012   Plantar fasciitis 08/12/2012   AS (aortic stenosis) 06/23/2011   HTN (hypertension) 06/23/2011   Discharge Diagnoses:  Patient Active Problem List   Diagnosis Date Noted   S/P ascending aortic aneurysm repair 08/09/2021   Aneurysm of ascending aorta without rupture (HCC)    Tear of meniscus of knee 12/12/2019   Non-recurrent bilateral inguinal hernia without obstruction or gangrene 11/30/2018   Closed fracture of metatarsal bone 04/17/2017   Hypogonadism in male 08/23/2014   Low serum testosterone 02/06/2014   Poison ivy 09/13/2012   Plantar fasciitis 08/12/2012   AS (aortic stenosis) 06/23/2011   HTN (hypertension) 06/23/2011    Discharged Condition: good  History of Present Illness:   The patient is a 51 year old gentleman with a history of hypertension and bicuspid aortic valve diagnosed in childhood who had been lost to follow-up and then underwent a 2D echocardiogram at [facility] in January 2023.  This showed a bicuspid aortic valve with some thickening and calcification.  The mean gradient was 28 mmHg with moderate aortic stenosis.  There is also a dilated ascending aorta measured at 4.8 cm.  The patient was referred to Dr. Clifton James and underwent a CTA of the chest on 05/17/2021.  This showed the aortic root to measure 4.0  to 4.2 cm at the level of the sinuses of Valsalva.  The aortic valve was thickened and calcified.  The ascending aorta had a diameter of 5.2 cm.  Proximal aortic arch measured 4.0 cm and the distal arch 2.5 cm.  The descending thoracic aorta measured 2.6 cm.  There is normal arch branch anatomy.  The patient was referred to Dr. Cliffton Asters in our office and plans were made for aortic valve replacement and replacement of the ascending aorta.  I had recently operated on 2 church acquaintances of the patient and he requested see me as a second opinion.   On 07/11/2021 he presented to the emergency room after developing some chest and back discomfort that he described as tightness and generally not feeling well.  He went to bed early but when he woke up he continued to have chest discomfort radiating up into his neck and down into his abdomen.  He denied any shortness of breath.  He had no numbness or weakness.  He underwent a CTA of the chest which ruled out aortic dissection but did show the ascending aortic aneurysm with a maximum diameter of 5.4 cm.   The patient denies any other chest discomfort.  In retrospect she reports some exertional fatigue particularly at the end of the day over the past few months.  He denies any dizziness or syncope.  He has had no peripheral edema.  Denies orthopnea.   He lives with his wife.  He has 3 children.  He has been checking his blood pressure at home and measurement of his systolic blood pressure is frequently in the 150-170 range which is new for him.    He underwent cath by Dr. McAlhany on 5/Clifton James23 which showed no coronary disease and a peak to peak gradient across the AV of 50 mm Hg with an LVEDP of 16-19.  The patient and all relevant studies were reviewed by Dr. Laneta Simmers who recommended repair of the aortic aneurysm as well as aortic valve replacement.   Hospital Course:   Grantley Savage presented to [facility] on 6/2 and was taken to the operating room.  He  underwent a Bentall procedure using a 27 mm connect Resilia aortic valve conduit and 30 mm Hemashield platinum vascular graft.  He tolerated the procedure well was taken to the surgical intensive care unit in stable condition.  He remained hemodynamically stable.  He was weaned from the ventilator per protocol and extubated on the first postoperative day.  Chest tube drainage gradually tapered off allowing the tube to be removed on postop day 2.  Monitoring lines were removed.  He was mobilized routinely.  Diuresis was begun on postop day 2 for expected volume excess.  The patient was hypertensive and started on Norvasc.  He was maintaining NSR and felt stable for transfer to the progressive care unit on 08/12/2021.  The patient developed Rapid Atrial Fibrillation.  He was started on Amiodarone bolus and drip protocol.  He converted to NSR with PACs and was transitioned to an oral regimen of Amiodarone.  He was hypertensive and his Lopressor dose was increased.  He has been weaned off oxygen as tolerated.  He has responded well to diuretics and his weight has improved.   He is ambulating without difficulty.  His surgical incisions are healing without evidence of infection.  He is medically stable for discharge home today.    Consults: None  Significant Diagnostic Studies: cardiac graphics:   CTA:  IMPRESSION: 1. No thoracic aortic dissection. 2. Ascending thoracic aortic aneurysm measuring 5.4 cm in diameter. Ascending thoracic aortic aneurysm. Recommend semi-annual imaging followup by CTA or MRA and referral to cardiothoracic surgery if not already obtained. This recommendation follows 2010 ACCF/AHA/AATS/ACR/ASA/SCA/SCAI/SIR/STS/SVM Guidelines for the Diagnosis and Management of Patients With Thoracic Aortic Disease. Circulation. 2010; 121: Z610-R604. Aortic aneurysm NOS (ICD10-I71.9) 3. Small hiatal hernia and patulous esophagus as can be seen with gastroesophageal reflux. 4. No acute abnormality  in the abdomen or pelvis.     Electronically Signed   By: Elige Ko M.D.   On: 07/11/2021 16:07   Treatments: surgery:   08/09/2021   Surgeon:  [name], MD   First Assistant: Gershon Crane,  PA-C : An experienced assistant was required given the complexity of this surgery and the standard of surgical care. The assistant was needed for exposure, dissection, suctioning, retraction of delicate tissues and sutures, instrument exchange and for overall help during this procedure.     Preoperative Diagnosis:  Bicuspid aortic valve stenosis and 5.5 cm ascending aortic aneurysm.   Postoperative Diagnosis:  Same    Procedure:   Median Sternotomy Extracorporeal circulation 3.   Replacement of the ascending aorta (hemi-arch) using a 30 mm Hemashield graft under deep hypothermic circulatory arrest 4.   Bentall Procedure using a 27 mm Edwards KONECT Resilia pericardial valved graft.    Anesthesia:  General Endotracheal    Discharge Exam: Blood pressure 136/86, pulse 73, temperature 98.7 F (37.1 C), temperature source Oral,  resp. rate 17, height 6' (1.829 m), weight 122.2 kg, SpO2 93 %.  General appearance: alert, cooperative, and no distress Heart: regular rate and rhythm Lungs: clear to auscultation bilaterally Abdomen: soft, distended, hypoactive BS Extremities: extremities normal, atraumatic, no cyanosis or edema Wound: clean and dry   Discharge Medications:  The patient has been discharged on:   1.Beta Blocker:  Yes [  x ]                              No   [   ]                              If No, reason:  2.Ace Inhibitor/ARB: Yes [   ]                                     No  [ x   ]                                     If No, reason: labile BP, no CAD  3.Statin:   Yes [   ]                  No  [ x  ]                  If No, reason: No CAD  4.Marlowe KaysValentino Hue  [ X  ]                  No   [   ]                  If No, reason:  Patient had ACS upon admission:  no  Plavix/P2Y12 inhibitor: Yes [   ]                                      No  [  x ]     Discharge Instructions     AMB Referral to Cardiac Rehabilitation - Phase II   Complete by: As directed    Diagnosis: Valve Replacement   Valve: Aortic   After initial evaluation and assessments completed: Virtual Based Care may be provided alone or in conjunction with Phase 2 Cardiac Rehab based on patient barriers.: Yes      Allergies as of 08/15/2021       Reactions   Bee Venom Anaphylaxis   Other Anaphylaxis   Wasps, Yellow Jackets    Ibuprofen Nausea And Vomiting, Other (See Comments)   GI Upset Ok if eat something before        Medication List     TAKE these medications    amiodarone 200 MG tablet Commonly known as: PACERONE Take 2 tablets (400 mg total) by mouth 2 (two) times daily. X 5 days, then decrease to 200 mg BID x 7 days, then decrease to 200 mg daily   amLODipine 5 MG tablet Commonly known as: NORVASC Take 1 tablet (5 mg total) by mouth daily. What changed:  medication strength how much to take   amoxicillin 500 MG capsule Commonly  known as: AMOXIL Take 2,000 mg by mouth once.   aspirin EC 81 MG tablet Take 1 tablet (81 mg total) by mouth daily. Swallow whole.   EPINEPHrine 0.3 mg/0.3 mL Soaj injection Commonly known as: EPI-PEN Inject 0.3 mg into the muscle as needed for anaphylaxis.   furosemide 40 MG tablet Commonly known as: LASIX Take 1 tablet (40 mg total) by mouth daily.   Garlic 1000 MG Caps Take 2,000 mg by mouth daily.   metoprolol tartrate 50 MG tablet Commonly known as: LOPRESSOR Take 1 tablet (50 mg total) by mouth 2 (two) times daily. What changed:  medication strength how much to take   Omega-3 1000 MG Caps Take 1,000 mg by mouth daily.   oxyCODONE 5 MG immediate release tablet Commonly known as: Oxy IR/ROXICODONE Take 1 tablet (5 mg total) by mouth every 4 (four) hours as needed for severe pain.   Testosterone 12.5  MG/ACT (1%) Gel Apply 2 Pump topically daily.        Follow-up Information     [facility] [place] Follow up on 08/21/2021.   Specialty: Cardiothoracic Surgery Why: Appointment is at 10:00 Contact information: [address] [place] [id number] [phone]        Alleen BorneBartle, Bryan K, MD Follow up on 09/05/2021.   Specialty: Cardiothoracic Surgery Why: Appointment is at 3:00, please get CXR at 2:30 at [facility] located on first floor our office building Contact information: [address] [place] [id number] [phone]                 Signed:  Lowella Dandyrin Wylie Coon, PA-C  08/15/2021, 7:40 AM

## 2021-08-13 ENCOUNTER — Other Ambulatory Visit: Payer: Self-pay | Admitting: Physician Assistant

## 2021-08-13 DIAGNOSIS — I7121 Aneurysm of the ascending aorta, without rupture: Secondary | ICD-10-CM

## 2021-08-13 MED ORDER — AMIODARONE HCL 200 MG PO TABS
400.0000 mg | ORAL_TABLET | Freq: Two times a day (BID) | ORAL | Status: DC
  Administered 2021-08-13 – 2021-08-15 (×4): 400 mg via ORAL
  Filled 2021-08-13 (×4): qty 2

## 2021-08-13 MED ORDER — KETOROLAC TROMETHAMINE 15 MG/ML IJ SOLN
15.0000 mg | Freq: Once | INTRAMUSCULAR | Status: AC
  Administered 2021-08-13: 15 mg via INTRAVENOUS
  Filled 2021-08-13: qty 1

## 2021-08-13 MED ORDER — METOLAZONE 5 MG PO TABS
2.5000 mg | ORAL_TABLET | Freq: Once | ORAL | Status: AC
Start: 2021-08-13 — End: 2021-08-13
  Administered 2021-08-13: 2.5 mg via ORAL
  Filled 2021-08-13: qty 1

## 2021-08-13 MED ORDER — AMIODARONE HCL IN DEXTROSE 360-4.14 MG/200ML-% IV SOLN
60.0000 mg/h | INTRAVENOUS | Status: AC
  Administered 2021-08-13: 60 mg/h via INTRAVENOUS
  Filled 2021-08-13 (×2): qty 200

## 2021-08-13 MED ORDER — AMIODARONE IV BOLUS ONLY 150 MG/100ML
150.0000 mg | Freq: Once | INTRAVENOUS | Status: AC
  Administered 2021-08-13: 150 mg via INTRAVENOUS
  Filled 2021-08-13 (×2): qty 100

## 2021-08-13 MED ORDER — AMIODARONE HCL IN DEXTROSE 360-4.14 MG/200ML-% IV SOLN
30.0000 mg/h | INTRAVENOUS | Status: DC
  Administered 2021-08-13: 30 mg/h via INTRAVENOUS

## 2021-08-13 NOTE — Plan of Care (Signed)
  Problem: Education: Goal: Will demonstrate proper wound care and an understanding of methods to prevent future damage Outcome: Progressing   Problem: Education: Goal: Knowledge of disease or condition will improve Outcome: Progressing   Problem: Activity: Goal: Risk for activity intolerance will decrease Outcome: Progressing   Problem: Cardiac: Goal: Will achieve and/or maintain hemodynamic stability Outcome: Progressing   Problem: Skin Integrity: Goal: Wound healing without signs and symptoms of infection Outcome: Progressing

## 2021-08-13 NOTE — Discharge Instructions (Signed)

## 2021-08-13 NOTE — Progress Notes (Signed)
Mobility Specialist: Progress Note   08/13/21 1500  Mobility  Activity Ambulated with assistance in hallway  Level of Assistance Standby assist, set-up cues, supervision of patient - no hands on  Assistive Device Front wheel walker  RUE Weight Bearing NWB  LUE Weight Bearing NWB  Distance Ambulated (ft) 390 ft  Activity Response Tolerated fair  $Mobility charge 1 Mobility   Pt received coming out of the BR and agreeable to ambulation. Stopped x3 for standing breaks secondary to SOB and chest pain, no rating given. Pt set up at the sink after session per NT request. NT present in the room.   [facility] Carena Stream Mobility Specialist Mobility Specialist 5 North: [phone] Mobility Specialist 6 North: [phone]

## 2021-08-13 NOTE — Progress Notes (Addendum)
      [address]       [place] [address]             [phone]      4 Days Post-Op Procedure(s) (LRB): BENTALL PROCEDURE USING Konect RESILIA  AORTIC VALVED CONDUIT (N/A) REPLACEMENT OF ASCENDING AORTIC ANEURYSM USING HEMASHIELD PLATINUM VASCULAR GRAFT (N/A) TRANSESOPHAGEAL ECHOCARDIOGRAM (TEE) (N/A)  Subjective:  Patient worn out this morning.  He just got back from a walk.  He is having pain across his chest as well and doesn't get full relief from medications.  + BM  Objective: Vital signs in last 24 hours: Temp:  [98 F (36.7 C)-98.8 F (37.1 C)] 98.8 F (37.1 C) (06/06 0427) Pulse Rate:  [79-99] 99 (06/06 0427) Cardiac Rhythm: Atrial flutter;Normal sinus rhythm (06/05 1911) Resp:  [11-21] 15 (06/06 0427) BP: (124-157)/(77-98) 124/77 (06/06 0427) SpO2:  [90 %-97 %] 95 % (06/06 0427) Weight:  [125.7 kg] 125.7 kg (06/06 0700)  Intake/Output from previous day: 06/05 0701 - 06/06 0700 In: -  Out: 1050 [Urine:1050]  General appearance: alert, cooperative, and no distress Heart: irregularly irregular rhythm Lungs: clear to auscultation bilaterally Abdomen: soft, non-tender; bowel sounds normal; no masses,  no organomegaly Extremities: edema trace Wound: clean and dry  Lab Results: Recent Labs    08/11/21 0414 08/12/21 0745  WBC 31.9* 29.2*  HGB 13.6 13.1  HCT 39.7 37.4*  PLT 109* 111*   BMET:  Recent Labs    08/11/21 0414 08/12/21 0745  NA 133* 133*  K 4.7 4.3  CL 104 100  CO2 23 24  GLUCOSE 156* 143*  BUN 16 23*  CREATININE 1.13 1.02  CALCIUM 7.9* 8.3*    PT/INR: No results for input(s): LABPROT, INR in the last 72 hours. ABG    Component Value Date/Time   PHART 7.402 08/10/2021 1205   HCO3 22.9 08/10/2021 1205   TCO2 24 08/10/2021 1205   ACIDBASEDEF 1.0 08/10/2021 1205   O2SAT 96 08/10/2021 1205   CBG (last 3)  Recent Labs    08/10/21 1348 08/10/21 1601 08/11/21 1603  GLUCAP 117* 119* 155*     Assessment/Plan: S/P Procedure(s) (LRB): BENTALL PROCEDURE USING Konect RESILIA  AORTIC VALVED CONDUIT (N/A) REPLACEMENT OF ASCENDING AORTIC ANEURYSM USING HEMASHIELD PLATINUM VASCULAR GRAFT (N/A) TRANSESOPHAGEAL ECHOCARDIOGRAM (TEE) (N/A)  CV- Atrial Fibrillation developed overnight- continue Lopressor, will start Amiodarone drip protocol, Norvasc for HTN.Marland Kitchen leave EPW in place Pulm- weaning oxygen as tolerated Renal- creatinine was normal yesterday, weight is trending down.. continue Lasix, will repeat Metolazone Hyponatremia- mild ID- + leukocytosis, no signs of infection Dispo- patient stable, developed atrial fibrillation overnight, start Amiodarone protocol, will try Toradol x 1 dose for pain, monitor leukocytosis.   LOS: 4 days    Johnny Dandy, PA-C 08/13/2021   Chart reviewed, patient examined, agree with above. He developed atrial fib with RVR overnight last night and started on IV amio this am with conversion around 9 am. He has been sinus since with PAC's on IV amio. He is about to switch to po amio.  Ambulated 3 times today and feels better. Still on oxygen. 9 lbs over preop this am. Continue diuresis.

## 2021-08-13 NOTE — Progress Notes (Signed)
CARDIAC REHAB PHASE I   PRE:  Rate/Rhythm: 93 SR with every other PACs    BP: sitting 113/63    SaO2: 97 RA  MODE:  Ambulation: 400 ft   POST:  Rate/Rhythm: 101 ST with PACs    BP: sitting 117/68     SaO2: 90 RA during walk, 87 RA EOB  Pt on EOB. Stood and ambulated with RW. Steady, slow. Able to ambulate without O2 with SaO2 maintaining 89-90 RA. Fatigue with distance. No afib noted. To EOB to eat lunch. SaO2 low with sitting therefore reapplied O2. Encouraged IS and another 1-2 walks. Wife very supportive.  [id number]   Ethelda Chick CES, ACSM 08/13/2021 12:28 PM

## 2021-08-13 NOTE — Progress Notes (Addendum)
Amio IV stopped @1853 . PO amio administered @ 1751. Pt current HR 82. Will continue to monitor.  , RN

## 2021-08-14 ENCOUNTER — Encounter (HOSPITAL_COMMUNITY): Payer: Self-pay | Admitting: Pediatric Cardiology

## 2021-08-14 MED ORDER — METOPROLOL TARTRATE 50 MG PO TABS
50.0000 mg | ORAL_TABLET | Freq: Two times a day (BID) | ORAL | Status: DC
  Administered 2021-08-14 – 2021-08-15 (×3): 50 mg via ORAL
  Filled 2021-08-14 (×3): qty 1

## 2021-08-14 MED ORDER — AMLODIPINE BESYLATE 10 MG PO TABS: 10.0000 mg | ORAL_TABLET | Freq: Every day | ORAL | Status: DC

## 2021-08-14 MED ORDER — AMLODIPINE BESYLATE 5 MG PO TABS
5.0000 mg | ORAL_TABLET | Freq: Every day | ORAL | Status: DC
  Administered 2021-08-14 – 2021-08-15 (×2): 5 mg via ORAL
  Filled 2021-08-14 (×2): qty 1

## 2021-08-14 NOTE — Progress Notes (Signed)
Received CCMD call. Notified pt had 3 beats of v-tach and 4 beats of vtach at 1713. Pt asymptomatic. Will continue to monitor the pt.   Lawson Radar, RN

## 2021-08-14 NOTE — Progress Notes (Addendum)
      [address]       ,[place] [address]             [phone]      5 Days Post-Op Procedure(s) (LRB): BENTALL PROCEDURE USING 27MM Konect RESILIA  AORTIC VALVED CONDUIT (N/A) REPLACEMENT OF ASCENDING AORTIC ANEURYSM USING 30MM HEMASHIELD PLATINUM VASCULAR GRAFT (N/A) TRANSESOPHAGEAL ECHOCARDIOGRAM (TEE) (N/A)  Subjective:  Patient sitting up in chair.  He doesn't know if pain medication helped yesterday as he continues to hurt.  + ambulation   BM  Objective: Vital signs in last 24 hours: Temp:  [98.6 F (37 C)-99.6 F (37.6 C)] 98.6 F (37 C) (06/07 0448) Pulse Rate:  [73-112] 75 (06/07 0448) Cardiac Rhythm: Normal sinus rhythm (06/06 2008) Resp:  [12-17] 17 (06/07 0448) BP: (100-152)/(65-85) 152/85 (06/07 0448) SpO2:  [90 %-94 %] 91 % (06/07 0602) Weight:  [122.8 kg] 122.8 kg (06/07 0639)  Intake/Output from previous day: 06/06 0701 - 06/07 0700 In: 338.8 [I.V.:338.8] Out: 1100 [Urine:1100]  General appearance: alert, cooperative, and no distress Heart: regular rate and rhythm Lungs: clear to auscultation bilaterally Abdomen: soft, distended, hypoactive BS Extremities: extremities normal, atraumatic, no cyanosis or edema Wound: clean and dry  Lab Results: Recent Labs    08/12/21 0745  WBC 29.2*  HGB 13.1  HCT 37.4*  PLT 111*   BMET:  Recent Labs    08/12/21 0745  NA 133*  K 4.3  CL 100  CO2 24  GLUCOSE 143*  BUN 23*  CREATININE 1.02  CALCIUM 8.3*    PT/INR: No results for input(s): LABPROT, INR in the last 72 hours. ABG    Component Value Date/Time   PHART 7.402 08/10/2021 1205   HCO3 22.9 08/10/2021 1205   TCO2 24 08/10/2021 1205   ACIDBASEDEF 1.0 08/10/2021 1205   O2SAT 96 08/10/2021 1205   CBG (last 3)  Recent Labs    08/11/21 1603  GLUCAP 155*    Assessment/Plan: S/P Procedure(s) (LRB): BENTALL PROCEDURE USING 27MM Konect RESILIA  AORTIC VALVED CONDUIT (N/A) REPLACEMENT OF ASCENDING AORTIC ANEURYSM USING  30MM HEMASHIELD PLATINUM VASCULAR GRAFT (N/A) TRANSESOPHAGEAL ECHOCARDIOGRAM (TEE) (N/A)  CV- PAF, in NSR with PACs this morning, + HTN will increase Lopressor to 50 mg BID, continue Norvasc at 5 mg daily Pulm- no acute issues, off oxygen, continue IS Renal- creatinine has been stable, weight is trending down, continue Lasix, potassium GI- has moved bowels, continue bowel regimen, for hypoactive BS, and mild distention Dispo- patient stable, will d/c EPW this morning, titrate BB for additional HR/BP control, continue diuretics, if remains stable for discharge home in the next 24-48 hours   LOS: 5 days    Ellwood Handler, PA-C 08/14/2021   Chart reviewed, patient examined, agree with above. He is maintaining sinus rhythm on oral amio and Lopressor. Lopressor dose increased for HTN. He is walking better today. I told him he could go home in the next day or two if rhythm remains stable.

## 2021-08-14 NOTE — Progress Notes (Signed)
Mobility Specialist: Progress Note   08/14/21 1630  Mobility  Activity Ambulated with assistance in hallway  Level of Assistance Standby assist, set-up cues, supervision of patient - no hands on  Assistive Device Front wheel walker  Distance Ambulated (ft) 840 ft  Activity Response Tolerated well  $Mobility charge 1 Mobility   Post-Mobility: 85 HR  Pt received in the bed and agreeable to ambulation. C/o chest soreness but said he wouldn't describe it as pain today. No other c/o throughout. Pt to the chair after session with NT present in the room.   [facility] Levone Otten Mobility Specialist Mobility Specialist 4 East: [phone]

## 2021-08-14 NOTE — Progress Notes (Signed)
D/c pacing wires per order. Pacing wires ends intact. Pt tolerated well. VSS.   Lawson Radar, RN

## 2021-08-14 NOTE — Progress Notes (Signed)
CARDIAC REHAB PHASE I   PRE:  Rate/Rhythm: 71 SR    BP: sitting 125/85    SaO2: 94 RA ear  MODE:  Ambulation: 470 ft   POST:  Rate/Rhythm: 88 SR    BP: sitting 132/67     SaO2: 97 RA ear  Pt needed mod assist to sit up from lying in bed. Stood independently and walked with RW. Slow and steady. C/o HA and sternal pain but overall tolerated well.  SaO2 is much higher on ear, in 90s. Finger is 87-88 RA (? Due to fluid in arms). To chair, encouraged x2 more walks. [id number]  Ethelda Chick CES, ACSM 08/14/2021 11:11 AM

## 2021-08-15 ENCOUNTER — Other Ambulatory Visit (HOSPITAL_COMMUNITY): Payer: Self-pay

## 2021-08-15 MED ORDER — ASPIRIN 81 MG PO TBEC: 81.0000 mg | DELAYED_RELEASE_TABLET | Freq: Every day | ORAL | 12 refills | Status: AC

## 2021-08-15 MED ORDER — METOPROLOL TARTRATE 50 MG PO TABS: 50.0000 mg | ORAL_TABLET | Freq: Two times a day (BID) | ORAL | 3 refills | Status: DC

## 2021-08-15 MED ORDER — AMLODIPINE BESYLATE 5 MG PO TABS: 5.0000 mg | ORAL_TABLET | Freq: Every day | ORAL | 3 refills | Status: DC

## 2021-08-15 MED ORDER — AMIODARONE HCL 200 MG PO TABS: 400.0000 mg | ORAL_TABLET | Freq: Two times a day (BID) | ORAL | 1 refills | Status: DC

## 2021-08-15 MED ORDER — FUROSEMIDE 40 MG PO TABS: 40.0000 mg | ORAL_TABLET | Freq: Every day | ORAL | 0 refills | Status: DC

## 2021-08-15 MED ORDER — OXYCODONE HCL 5 MG PO TABS
5.0000 mg | ORAL_TABLET | ORAL | 0 refills | Status: DC | PRN
Start: 2021-08-15 — End: 2021-08-21
  Filled 2021-08-15: qty 30, 5d supply, fill #0

## 2021-08-15 MED FILL — Potassium Chloride Inj 2 mEq/ML: INTRAVENOUS | Qty: 20 | Status: AC

## 2021-08-15 MED FILL — Lidocaine HCl Local Preservative Free (PF) Inj 2%: INTRAMUSCULAR | Qty: 15 | Status: AC

## 2021-08-15 MED FILL — Mannitol IV Soln 20%: INTRAVENOUS | Qty: 500 | Status: AC

## 2021-08-15 MED FILL — Sodium Chloride IV Soln 0.9%: INTRAVENOUS | Qty: 5000 | Status: AC

## 2021-08-15 MED FILL — Heparin Sodium (Porcine) Inj 1000 Unit/ML: INTRAMUSCULAR | Qty: 10 | Status: AC

## 2021-08-15 MED FILL — Electrolyte-R (PH 7.4) Solution: INTRAVENOUS | Qty: 5000 | Status: AC

## 2021-08-15 MED FILL — Heparin Sodium (Porcine) Inj 1000 Unit/ML: INTRAMUSCULAR | Qty: 30 | Status: AC

## 2021-08-15 MED FILL — Sodium Bicarbonate IV Soln 8.4%: INTRAVENOUS | Qty: 50 | Status: AC

## 2021-08-15 NOTE — Progress Notes (Signed)
Discussed with pt and wife IS, sternal precautions, diet, exercise, and CRPII. Receptive. Will refer to G'sO CRPII.  [id number] Ethelda Chick CES, ACSM 08/15/2021 11:10 AM

## 2021-08-15 NOTE — Progress Notes (Signed)
   07/29/21 0002  CM Assessment  Discharge Readiness Status Complete  Expected Discharge Plan Home/Self Care  In-house Referral NA  Discharge Planning Services NA  Post Acute Care Choice NA  Choice offered to / list presented to  NA  DME Arranged N/A  DME Agency NA  HH Arranged NA  HH Agency NA  Status of Service Completed, signed off  Discharge Disposition Home/Self Care

## 2021-08-15 NOTE — Progress Notes (Signed)
Pt discharging home with wife.  All instructions given and reviewed, all questions answered.  Meds delivered from [facility] pharm.

## 2021-08-15 NOTE — Progress Notes (Signed)
6 Days Post-Op Procedure(s) (LRB): BENTALL PROCEDURE USING Konect RESILIA  AORTIC VALVED CONDUIT (N/A) REPLACEMENT OF ASCENDING AORTIC ANEURYSM USING HEMASHIELD PLATINUM VASCULAR GRAFT (N/A) TRANSESOPHAGEAL ECHOCARDIOGRAM (TEE) (N/A) Subjective:  Did not sleep well in the uncomfortable bed but feels well and has already walked twice. Pain under good control.  Objective: Vital signs in last 24 hours: Temp:  [98.4 F (36.9 C)-99.7 F (37.6 C)] 98.7 F (37.1 C) (06/08 0359) Pulse Rate:  [73-89] 73 (06/08 0359) Cardiac Rhythm: Normal sinus rhythm (06/07 1900) Resp:  [12-19] 17 (06/08 0359) BP: (115-159)/(64-121) 136/86 (06/08 0359) SpO2:  [92 %-100 %] 93 % (06/08 0359) Weight:  [122.2 kg] 122.2 kg (06/08 0233)  Hemodynamic parameters for last 24 hours:    Intake/Output from previous day: 06/07 0701 - 06/08 0700 In: 240 [P.O.:240] Out: 1845 [Urine:1845] Intake/Output this shift: No intake/output data recorded.  General appearance: alert and cooperative Neurologic: intact Heart: regular rate and rhythm, S1, S2 normal, no murmur Lungs: clear to auscultation bilaterally Extremities: no edema Wound: incision healing well.  Lab Results: Recent Labs    08/12/21 0745  WBC 29.2*  HGB 13.1  HCT 37.4*  PLT 111*   BMET:  Recent Labs    08/12/21 0745  NA 133*  K 4.3  CL 100  CO2 24  GLUCOSE 143*  BUN 23*  CREATININE 1.02  CALCIUM 8.3*    PT/INR: No results for input(s): "LABPROT", "INR" in the last 72 hours. ABG    Component Value Date/Time   PHART 7.402 08/10/2021 1205   HCO3 22.9 08/10/2021 1205   TCO2 24 08/10/2021 1205   ACIDBASEDEF 1.0 08/10/2021 1205   O2SAT 96 08/10/2021 1205   CBG (last 3)  No results for input(s): "GLUCAP" in the last 72 hours.  Assessment/Plan: S/P Procedure(s) (LRB): BENTALL PROCEDURE USING Konect RESILIA  AORTIC VALVED CONDUIT (N/A) REPLACEMENT OF ASCENDING AORTIC ANEURYSM USING HEMASHIELD PLATINUM VASCULAR  GRAFT (N/A) TRANSESOPHAGEAL ECHOCARDIOGRAM (TEE) (N/A)  POD 6 Hemodynamically stable in sinus rhythm on Lopressor and amio. No further AF.  Wt is back to baseline. He does not need any further diuresis.  Plan to discharge today. Chest tube sutures out in office.   LOS: 6 days    [name] 08/15/2021

## 2021-08-15 NOTE — Progress Notes (Signed)
Mobility Specialist: Progress Note   08/15/21 1026  Mobility  Activity Ambulated independently in hallway  Level of Assistance Independent  Assistive Device None  Distance Ambulated (ft) 470 ft  Activity Response Tolerated well  $Mobility charge 1 Mobility   Pt received in the bed and agreeable to ambulation. C/o chest soreness during session as well as general fatigue from lack of sleep last night. Pt to the chair after session with pt's wife present in the room.   [facility] Loden Laurent Mobility Specialist Mobility Specialist 4 East: [phone]

## 2021-08-20 ENCOUNTER — Encounter (HOSPITAL_COMMUNITY): Payer: Self-pay | Admitting: Emergency Medicine

## 2021-08-20 ENCOUNTER — Telehealth: Payer: Self-pay

## 2021-08-20 ENCOUNTER — Emergency Department (HOSPITAL_COMMUNITY)
Admission: EM | Admit: 2021-08-20 | Discharge: 2021-08-20 | Discharge status: Against Medical Advice | Payer: Managed Care, Other (non HMO) | Attending: Student | Admitting: Student

## 2021-08-20 ENCOUNTER — Other Ambulatory Visit: Payer: Self-pay

## 2021-08-20 ENCOUNTER — Emergency Department (HOSPITAL_COMMUNITY): Payer: Managed Care, Other (non HMO)

## 2021-08-20 DIAGNOSIS — M79601 Pain in right arm: Secondary | ICD-10-CM | POA: Insufficient documentation

## 2021-08-20 DIAGNOSIS — Z5321 Procedure and treatment not carried out due to patient leaving prior to being seen by health care provider: Secondary | ICD-10-CM | POA: Insufficient documentation

## 2021-08-20 DIAGNOSIS — M542 Cervicalgia: Secondary | ICD-10-CM | POA: Diagnosis not present

## 2021-08-20 LAB — BASIC METABOLIC PANEL
Anion gap: 10 (ref 5–15)
BUN: 16 mg/dL (ref 6–20)
CO2: 28 mmol/L (ref 22–32)
Calcium: 8.7 mg/dL — ABNORMAL LOW (ref 8.9–10.3)
Chloride: 95 mmol/L — ABNORMAL LOW (ref 98–111)
Creatinine, Ser: 1.23 mg/dL (ref 0.61–1.24)
GFR, Estimated: >60 mL/min (ref >60)
Glucose, Bld: 104 mg/dL — ABNORMAL HIGH (ref 70–99)
Potassium: 4.2 mmol/L (ref 3.5–5.1)
Sodium: 133 mmol/L — ABNORMAL LOW (ref 135–145)

## 2021-08-20 LAB — CBC
HCT: 35.2 % — ABNORMAL LOW (ref 39.0–52.0)
Hemoglobin: 11.4 g/dL — ABNORMAL LOW (ref 13.0–17.0)
MCH: 32 pg (ref 26.0–34.0)
MCHC: 32.4 g/dL (ref 30.0–36.0)
MCV: 98.9 fL (ref 80.0–100.0)
Platelets: 514 10*3/uL — ABNORMAL HIGH (ref 150–400)
RBC: 3.56 MIL/uL — ABNORMAL LOW (ref 4.22–5.81)
RDW: 13.1 % (ref 11.5–15.5)
WBC: 19.5 10*3/uL — ABNORMAL HIGH (ref 4.0–10.5)
nRBC: 0 % (ref 0.0–0.2)

## 2021-08-20 NOTE — ED Triage Notes (Signed)
Pt states that last night he started having right arm pain that moves into right neck/eye. Pt states today his right fingers turned pale and numb. This lasted about an hour. Pt states that now his hand is normal but still has the pain in his arm. Pt had a valve replacement on June 2 of this year.

## 2021-08-20 NOTE — Telephone Encounter (Signed)
Patient's wife called stating patient is having trouble with his right arm/hand. He is s/p Bentall with Dr. Cyndia Bent 08/09/21. States that it "feels weird". Pulse ox on that hand is 80-81%. No tingling, good pulse. But he states that the whole arm is cold to touch and his knuckles are swollen. Also says that he has been experiencing pain/ tingling shooting up his back to his neck and eye on the right side. This started last night and has not stopped. Advised patient should go to the ED. They are heading to [facility].

## 2021-08-20 NOTE — ED Notes (Signed)
Pt left with wife at this time. Pt plans to make appt with primary and will return if condition worsens.

## 2021-08-20 NOTE — ED Provider Triage Note (Signed)
Emergency Medicine Provider Triage Evaluation Note  RAJI GLINSKI , a 51 y.o. male  was evaluated in triage.  Pt complains of right arm pain starting last night.  Last week patient had a ascending aortic aneurysm repair and aortic valve replacement by Dr. Lavinia Sharps.  He reports last night he started having right arm pain that moved into his right neck and I, and felt as though his right fingers turn pale and numb.  He was trying to check his oxygen saturation and saw his as low as 88% on that side.  Review of Systems  Positive: As above Negative: Numbness, injury  Physical Exam  BP (!) 141/79   Pulse (!) 58   Temp 98.8 F (37.1 C) (Oral)   Resp 16   SpO2 99%  Gen:   Awake, no distress   Resp:  Normal effort  MSK:   Moves extremities without difficulty  Other:  2+ radial pulse in RLE, cool to the touch, moving both UE without difficulties  Medical Decision Making  Medically screening exam initiated at 1:58 PM.  Appropriate orders placed.  Josefa Half was informed that the remainder of the evaluation will be completed by another provider, this initial triage assessment does not replace that evaluation, and the importance of remaining in the ED until their evaluation is complete.  Consulted with vascular surgery, who recommended speaking with patient's cardiothoracic surgeon.  Will start with basic labs and a dissection study, and will make adjustments to orders as needed.   Fredi Geiler T, PA-C 08/20/21 1400

## 2021-08-21 ENCOUNTER — Other Ambulatory Visit: Payer: Self-pay | Admitting: *Deleted

## 2021-08-21 MED ORDER — TRAMADOL HCL 50 MG PO TABS: 50.0000 mg | ORAL_TABLET | Freq: Four times a day (QID) | ORAL | 0 refills | Status: DC | PRN

## 2021-08-21 NOTE — Progress Notes (Signed)
Patient requesting refill of pain medication. Tramadol called into patient's preferred pharmacy per Dr. Laneta Simmers. Patient made aware.

## 2021-08-22 ENCOUNTER — Ambulatory Visit (INDEPENDENT_AMBULATORY_CARE_PROVIDER_SITE_OTHER): Payer: Self-pay

## 2021-08-22 DIAGNOSIS — Z4802 Encounter for removal of sutures: Secondary | ICD-10-CM

## 2021-08-22 NOTE — Progress Notes (Signed)
Removed 2 sutures from chest tube sites with no signs of infection and patient tolerated well. Dr Laneta Simmers d/c'ed Pacerone yesterday due to possible side of effects of coldness/numbness in both hands. He states that his hands and fingers and starting to feel normal again. He was instructed to call our office if sx's reoccur.

## 2021-08-23 ENCOUNTER — Telehealth: Payer: Self-pay | Admitting: *Deleted

## 2021-08-23 NOTE — Telephone Encounter (Signed)
Patient's wife came to the office to pick up paperwork and showed a photo of patient's hands. Photo showed patients hands with whiteness from knuckles to fingertips. Patient states when this occurs he has a shooting pain that runs up this arms into his neck and head. Sensation first began Tuesday in right hand. Per patient it then resolved. Today it has occurred in both hands. Photo shown to Dr. Laneta Simmers. Per Dr. Laneta Simmers, patient instructed to stop taking Metoprolol and to increase taking Amlodipine from 5mg  to 10mg . Patient advised to keep hands warm and to run hands under warm water when this occurs. Patient states he has used ice and massage to neck to help alleviate neck pain which has seemed to help. Advised patient to contact our office early next week with update after stopping medication and to avoid medications such as sudafed and nasal sprays. Patient also advised to go to ED if symptoms become increasingly worse. Patient verbalized his understanding.

## 2021-08-26 ENCOUNTER — Telehealth (HOSPITAL_COMMUNITY): Payer: Self-pay

## 2021-08-26 NOTE — Telephone Encounter (Signed)
Referral recv'd and verified for MD signature. Follow up appointment is on 09/05/2021. Insurance benefits and eligibility TBD.

## 2021-08-27 ENCOUNTER — Ambulatory Visit: Payer: Managed Care, Other (non HMO) | Admitting: Physician Assistant

## 2021-09-03 ENCOUNTER — Other Ambulatory Visit: Payer: Self-pay | Admitting: Surgery

## 2021-09-03 DIAGNOSIS — Z8679 Personal history of other diseases of the circulatory system: Secondary | ICD-10-CM

## 2021-09-04 ENCOUNTER — Ambulatory Visit (INDEPENDENT_AMBULATORY_CARE_PROVIDER_SITE_OTHER): Payer: Managed Care, Other (non HMO) | Admitting: Surgery

## 2021-09-04 ENCOUNTER — Encounter: Payer: Self-pay | Admitting: Surgery

## 2021-09-04 ENCOUNTER — Ambulatory Visit
Admission: RE | Admit: 2021-09-04 | Discharge: 2021-09-04 | Disposition: A | Discharge status: Home / Self Care | Payer: Managed Care, Other (non HMO) | Source: Ambulatory Visit | Attending: Surgery | Admitting: Surgery

## 2021-09-04 VITALS — BP 119/82 | HR 90 | Resp 20 | Ht 72.0 in | Wt 263.0 lb

## 2021-09-04 DIAGNOSIS — Z8679 Personal history of other diseases of the circulatory system: Secondary | ICD-10-CM

## 2021-09-04 DIAGNOSIS — Z9889 Other specified postprocedural states: Secondary | ICD-10-CM

## 2021-09-04 MED ORDER — POTASSIUM CHLORIDE ER 10 MEQ PO TBCR: 20.0000 meq | EXTENDED_RELEASE_TABLET | Freq: Every day | ORAL | 0 refills | Status: DC

## 2021-09-04 MED ORDER — FUROSEMIDE 40 MG PO TABS: 40.0000 mg | ORAL_TABLET | Freq: Every day | ORAL | 0 refills | Status: DC

## 2021-09-04 NOTE — Progress Notes (Signed)
HPI:  Patient returns for routine postoperative follow-up having undergone replacement of the ascending aorta using a 38 mm Hemashield graft under deep hypothermic circulatory arrest and Bentall procedure using a 27 mm KONECT Resilia pericardial valved graft on 08/09/2021.  The patient's early postoperative recovery while in the hospital was notable for development of postoperative atrial fibrillation that was converted on amiodarone.  He was sent home on amiodarone and Lopressor.  He had a fair amount of postoperative chest wall pain which improved with time. Since hospital discharge the patient reports that his stamina has been improving.  He is walking daily and increasing the distance.  He had a few episodes of a severe Raynaud-type phenomenon in both hands where all of his fingers turned white, associated with numbness and tingling.  He sent [place] a picture to the office which I had never seen to this degree before.  He had no prior symptoms of this.  I suspected that it may be due to autonomic nerve dysfunction related to sternal retraction and stretching of the nerves going through the shoulders since he had a large upper body and it took considerable force to retract open his sternum.  I stopped  the amiodarone and Lopressor in case these were contributing.  He also said that he had some knots in his upper back that were massaged and he feels like that may have helped because after that the symptoms resolved and did not recur.  I think it was most likely due to autonomic nerve dysfunction.  He reports some ongoing swelling in his ankles.  He was sent home on Lasix for a couple weeks which has run out.  He has some other of the more common complaints like not being able to breathe when laying flat and having to sleep in recliner.  He is also felt cold at times.  He denies any fever.   Current Outpatient Medications  Medication Sig Dispense Refill   acetaminophen (TYLENOL) 500 MG tablet Take 1,000 mg  by mouth every 6 (six) hours as needed for mild pain.     amLODipine (NORVASC) 5 MG tablet Take 1 tablet (5 mg total) by mouth daily. (Patient taking differently: Take 10 mg by mouth daily.) 30 tablet 3   aspirin EC 81 MG tablet Take 1 tablet (81 mg total) by mouth daily. Swallow whole. (Patient taking differently: Take 81 mg by mouth daily.) 30 tablet 12   furosemide (LASIX) 40 MG tablet Take 1 tablet (40 mg total) by mouth daily. 30 tablet 0   potassium chloride (KLOR-CON) 10 MEQ tablet Take 2 tablets (20 mEq total) by mouth daily. 60 tablet 0   traMADol (ULTRAM) 50 MG tablet Take 1 tablet (50 mg total) by mouth every 6 (six) hours as needed. 28 tablet 0   amiodarone (PACERONE) 200 MG tablet Take 2 tablets (400 mg total) by mouth 2 (two) times daily. X 5 days, then decrease to 200 mg BID x 7 days, then decrease to 200 mg daily (Patient not taking: Reported on 09/04/2021) 90 tablet 1   furosemide (LASIX) 40 MG tablet Take 1 tablet (40 mg total) by mouth daily. (Patient not taking: Reported on 09/04/2021) 7 tablet 0   metoprolol tartrate (LOPRESSOR) 50 MG tablet Take 1 tablet (50 mg total) by mouth 2 (two) times daily. (Patient not taking: Reported on 09/04/2021) 60 tablet 3   No current facility-administered medications for this visit.    Physical Exam: BP 119/82   Pulse 90  Resp 20   Ht 6' (1.829 m)   Wt 263 lb (119.3 kg)   SpO2 95%   BMI 35.67 kg/m  He looks well. Cardiac exam shows regular rate and rhythm with normal heart sounds.  There is no murmur. Lungs are clear but there is decreased breath sounds at the left base. Chest incision is healing well and the sternum is stable. There is mild bilateral ankle edema. His upper extremities appear unremarkable with strong palpable radial pulses bilaterally.  Diagnostic Tests:  Narrative & Impression  CLINICAL DATA:  Ascending aortic aneurysm repair.   EXAM: CHEST - 2 VIEW   COMPARISON:  08/12/2021 and CT chest 07/11/2021.    FINDINGS: Trachea is midline. Heart is enlarged. Sternotomy wires are unchanged in position. Streaky opacification in the lingula and left lower lobe. Right lung is clear. No pleural fluid. Left hemidiaphragm is elevated.   IMPRESSION: Probable atelectasis in the lingula and left lower lobe. Difficult to exclude pneumonia.     Electronically Signed   By: Leanna Battles M.D.   On: 09/04/2021 15:31      Impression:  I think he is doing well for 3-1/2 weeks following this surgery.  I encouraged him to continue walking as much as possible and increase the distance as tolerated.  I told him he can return to driving a car.  I asked him not to do any heavy lifting for 3 months postoperatively.  I encouraged him to continue using his incentive spirometer.  He does have some lower extremity edema and may have some diastolic congestive heart failure.  I started him on Lasix 40 mg daily and potassium chloride 20 meq daily and ask him to record his weight daily.  I think this will probably resolve the lower extremity edema.  Plan:  He is scheduled to follow-up with cardiology in early July and has an echo scheduled for July 11.  I will plan to see him back in 1 month with a repeat chest x-ray to follow-up on his left lower lobe atelectasis   [name], MD [facility] [phone]

## 2021-09-05 ENCOUNTER — Ambulatory Visit: Payer: Managed Care, Other (non HMO) | Admitting: Surgery

## 2021-09-09 NOTE — Progress Notes (Unsigned)
Office Visit    Patient Name: Johnny Henderson Date of Encounter: 09/11/2021  PCP:  Shellia Cleverly, PA   [facility]  Cardiologist:  Verne Carrow, MD  Advanced Practice Provider:  No care team member to display Electrophysiologist:  None   Chief Complaint    Johnny Henderson is a 51 y.o. male with a hx of HTN, dilated ascending aorta and bicuspid aortic valve with moderate aortic stenosis presents today for follow-up visit.   The patient was seen for a new evaluation for aortic stenosis by Dr. Clifton James in March 2023.  Echocardiogram January 2023 at [facility] with LVEF of 55 to 60%.  The aortic valve was noted to be bicuspid.  Moderate aortic stenosis with a mean gradient of 28 mmHg.  Dilated ascending aorta.  He was feeling well at that time.  Chest CTA with dilated ascending aorta of 5.2 cm.  He was initially seen by Dr. Cliffton Asters outpatient.  He was then seen in the ED with chest pain 07/11/2021 with repeat CTA May 2023.  Dilated ascending aorta was 5.4 cm and he was seen by Dr. Laneta Simmers.  He underwent an AVR and aortic root replacement 08/09/2021.   He was seen back in the [facility] office by Dr. Laneta Simmers 09/04/2021.  He was having some Raynaud's-like symptoms in both hands likely due to the autonomic nerve dysfunction related to sternal retraction and stretching of the nerves going through the shoulders.  His amiodarone and Lopressor were discontinued at this time.  He is scheduled for an echocardiogram July 11.  Today, he feels pretty good from a cardiovascular standpoint.  He has continued sternal soreness from his sternotomy incision.  He also has some lingering lower extremity edema.  He was started on Lasix 40 mg daily last week by Dr. Laneta Simmers.  He states that this has not really helped much.  We discussed elevating his feet when in seated position and wearing support socks/stockings.  He is only taking Tylenol for his pain at this point.  He has no longer  requiring tramadol.  He was taken off of both amiodarone and Lopressor due to side effects.  He is scheduled for a follow-up echocardiogram next week.  He states that his Raynaud's-like symptoms have pretty much resolved.  He asked about going in the water at the beach/pool and I suggested only putting his feet in for now.  Usually we like to wait until the incision is completely healed before submerging in water.  He does have a small left lower lobe pleural effusion that is being followed by [facility].  He has an appointment in about a month with a follow-up chest x-ray to make sure this has resolved.  Reports no shortness of breath nor dyspnea on exertion. Reports no chest pain, pressure, or tightness. No orthopnea, PND. Reports no palpitations.    Past Medical History    Past Medical History:  Diagnosis Date   Aortic stenosis    diagnosed at age 42   Heart murmur    History of chicken pox    Hypertension    Hypogonadism male    Low serum testosterone    Non-recurrent bilateral inguinal hernia    PONV (postoperative nausea and vomiting)    Past Surgical History:  Procedure Laterality Date   BENTALL PROCEDURE N/A 08/09/2021   Procedure: BENTALL PROCEDURE USING Konect RESILIA  AORTIC VALVED CONDUIT;  Surgeon: [name], MD;  Location: [facility] OR;  Service: Open Heart Surgery;  Laterality: N/A;  CIRC ARREST   KNEE SURGERY Right    age 89s. x2   LEFT HEART CATH AND CORONARY ANGIOGRAPHY N/A 07/26/2021   Procedure: LEFT HEART CATH AND CORONARY ANGIOGRAPHY;  Surgeon: Kathleene Hazel, MD;  Location: [facility] INVASIVE CV LAB;  Service: Cardiovascular;  Laterality: N/A;   TEE WITHOUT CARDIOVERSION N/A 08/09/2021   Procedure: TRANSESOPHAGEAL ECHOCARDIOGRAM (TEE);  Surgeon: [name], MD;  Location: [facility] OR;  Service: Open Heart Surgery;  Laterality: N/A;   THORACIC AORTIC ANEURYSM REPAIR N/A 08/09/2021   Procedure: REPLACEMENT OF ASCENDING AORTIC ANEURYSM USING HEMASHIELD PLATINUM VASCULAR  GRAFT;  Surgeon: [name], MD;  Location: [facility] OR;  Service: Open Heart Surgery;  Laterality: N/A;    Allergies  Allergies  Allergen Reactions   Bee Venom Anaphylaxis   Other Anaphylaxis    Wasps Yellow Jackets    Motrin [Ibuprofen] Nausea And Vomiting and Other (See Comments)    GI Upset Ok if eat something before    EKGs/Labs/Other Studies Reviewed:   The following studies were reviewed today:  08/09/2021 Echocardiogram  PRE-OP FINDINGS   Left Ventricle: The left ventricle has low normal systolic function, with  an ejection fraction of 50-55%. The cavity size was normal. No evidence of  left ventricular regional wall motion abnormalities. There is no left  ventricular hypertrophy. Left  ventricular diastolic function could not be evaluated.   Right Ventricle: The right ventricle has normal systolic function. The  cavity was normal. There is no increase in right ventricular wall  thickness. There is no aneurysm seen. PA catheter traversing the right  ventricle into the main pulmonary artery.   Left Atrium: Left atrial size was normal in size. No left atrial/left  atrial appendage thrombus was detected. The left atrial appendage is well  visualized and there is no evidence of thrombus present. Left atrial  appendage velocity is normal at greater  than 40 cm/s.   Right Atrium: Right atrial size was normal in size. PA catheter traversing  the right atrium into the right ventricle.   Interatrial Septum: No atrial level shunt detected by color flow Doppler.  There is no evidence of a patent foramen ovale.   Pericardium: There is no evidence of pericardial effusion. There is no  pleural effusion.   Mitral Valve: The mitral valve is normal in structure. Mitral valve  regurgitation is trivial by color flow Doppler. The MR jet is  centrally-directed. There is no evidence of mitral valve vegetation. There  is no evidence of mitral stenosis.   Tricuspid Valve: The  tricuspid valve was normal in structure. Tricuspid  valve regurgitation is trivial by color flow Doppler. The jet is directed  centrally. No evidence of tricuspid stenosis is present. There is no  evidence of tricuspid valve vegetation.   Aortic Valve: The aortic valve is bicuspid with thickened leaflets but  good leaflet motion. Possible fusion of the left coronary and right  coronary leaflet. No regurgitation was not visualized by color flow  Doppler. There is no stenosis of the aortic  valve, mean gradient of 12 mmHg. There is no evidence of aortic valve  vegetation.   Pulmonic Valve: The pulmonic valve was normal in structure, with normal.  Pulmonic valve regurgitation is not visualized by color flow Doppler.    Aorta: The aortic arch are normal in size and structure. There is  dilatation of the aortic root, measuring 38 mm. There is an aneurysm  involving the ascending aorta measuring 55 mm. There is evidence of  immobile plaque in the descending aorta; Grade I,  measuring 1-63mm in size.   Pulmonary Artery: Theone Murdoch catheter present on the right. The pulmonary  artery is of normal size.   EKG:  EKG is ordered today.  The ekg ordered today demonstrates NSR with one PVC  Recent Labs: 08/07/2021: ALT 39 08/10/2021: Magnesium 2.2 08/20/2021: BUN 16; Creatinine, Ser 1.23; Hemoglobin 11.4; Platelets 514; Potassium 4.2; Sodium 133  Recent Lipid Panel No results found for: "CHOL", "TRIG", "HDL", "CHOLHDL", "VLDL", "LDLCALC", "LDLDIRECT"  Home Medications   Current Meds  Medication Sig   acetaminophen (TYLENOL) 500 MG tablet Take 1,000 mg by mouth every 6 (six) hours as needed for mild pain.   amLODipine (NORVASC) 10 MG tablet Take 10 mg by mouth daily.   aspirin EC 81 MG tablet Take 1 tablet (81 mg total) by mouth daily. Swallow whole. (Patient taking differently: Take 81 mg by mouth daily.)   furosemide (LASIX) 40 MG tablet Take 1 tablet (40 mg total) by mouth daily.   potassium  chloride (KLOR-CON) 10 MEQ tablet Take 2 tablets (20 mEq total) by mouth daily.     Review of Systems      All other systems reviewed and are otherwise negative except as noted above.  Physical Exam    VS:  BP (!) 130/98   Pulse 80   Ht 6' (1.829 m)   Wt 257 lb (116.6 kg)   BMI 34.86 kg/m  , BMI Body mass index is 34.86 kg/m.  Wt Readings from Last 3 Encounters:  09/11/21 257 lb (116.6 kg)  09/04/21 263 lb (119.3 kg)  08/15/21 269 lb 4.8 oz (122.2 kg)     GEN: Well nourished, well developed, in no acute distress. HEENT: normal. Neck: Supple, no JVD, carotid bruits, or masses. Cardiac: RRR, no murmurs, rubs, or gallops. No clubbing, cyanosis, edema.  Radials/PT 2+ and equal bilaterally.  Respiratory:  Respirations regular and unlabored, clear to auscultation bilaterally.  Diminished breath sounds in lower left quadrant. GI: Soft, nontender, nondistended. MS: No deformity or atrophy. Skin: Warm and dry, no rash. Neuro:  Strength and sensation are intact. Psych: Normal affect.  Assessment & Plan    Dilated ascending aorta and bicuspid aortic valve with moderate aortic stenosis s/p Bentall procedure with Dr. Laneta Simmers 08/09/2021 -Doing well per surgery -Blood pressure is well controlled today -Continue current medication regimen with Norvasc 10 mg daily, ASA 81 mg daily, furosemide 40 mg daily, and potassium 10 mEq twice daily -Did not tolerate Lopressor or amiodarone therefore they have been removed from his MAR  Hypertension -Controlled today -Continue current medication regimen  Lower extremity edema -Continue Lasix 40 mg daily -Elevate legs when seated -Encouraged compression socks/stockings to be worn -Encourage frequent ambulation    Cardiac Rehabilitation Eligibility Assessment  The patient is ready to start cardiac rehabilitation from a cardiac standpoint.    Disposition: Follow up  3 months  with Verne Carrow, MD or APP.  Signed, Sharlene Dory,  PA-C 09/11/2021, 1:32 PM [facility]

## 2021-09-11 ENCOUNTER — Encounter: Payer: Self-pay | Admitting: Physician Assistant

## 2021-09-11 ENCOUNTER — Ambulatory Visit: Payer: Managed Care, Other (non HMO) | Admitting: Physician Assistant

## 2021-09-11 VITALS — BP 130/98 | HR 80 | Ht 72.0 in | Wt 257.0 lb

## 2021-09-11 DIAGNOSIS — Z8679 Personal history of other diseases of the circulatory system: Secondary | ICD-10-CM

## 2021-09-11 DIAGNOSIS — I350 Nonrheumatic aortic (valve) stenosis: Secondary | ICD-10-CM | POA: Diagnosis not present

## 2021-09-11 DIAGNOSIS — I10 Essential (primary) hypertension: Secondary | ICD-10-CM

## 2021-09-11 DIAGNOSIS — Z9889 Other specified postprocedural states: Secondary | ICD-10-CM

## 2021-09-11 DIAGNOSIS — I7121 Aneurysm of the ascending aorta, without rupture: Secondary | ICD-10-CM

## 2021-09-11 NOTE — Patient Instructions (Signed)
Medication Instructions:  Your physician recommends that you continue on your current medications as directed. Please refer to the Current Medication list given to you today.  *If you need a refill on your cardiac medications before your next appointment, please call your pharmacy*   Lab Work: Fasting lipids prior to your next appointment If you have labs (blood work) drawn today and your tests are completely normal, you will receive your results only by: MyChart Message (if you have MyChart) OR A paper copy in the mail If you have any lab test that is abnormal or we need to change your treatment, we will call you to review the results.   Follow-Up: At [facility], you and your health needs are our priority.  As part of our continuing mission to provide you with exceptional heart care, we have created designated Provider Care Teams.  These Care Teams include your primary Cardiologist (physician) and Advanced Practice Providers (APPs -  Physician Assistants and Nurse Practitioners) who all work together to provide you with the care you need, when you need it.  We recommend signing up for the patient portal called "MyChart".  Sign up information is provided on this After Visit Summary.  MyChart is used to connect with patients for Virtual Visits (Telemedicine).  Patients are able to view lab/test results, encounter notes, upcoming appointments, etc.  Non-urgent messages can be sent to your provider as well.   To learn more about what you can do with MyChart, go to [email or web address].    Your next appointment:   3 month(s) with fasting lipids prior  The format for your next appointment:   In Person  Provider:   Verne Carrow, MD  or Jari Favre, PA-C       Other Instructions Consider compression stockings or socks as we discussed  Important Information About Sugar

## 2021-09-17 ENCOUNTER — Ambulatory Visit (HOSPITAL_COMMUNITY): Payer: Managed Care, Other (non HMO) | Attending: Cardiovascular Disease

## 2021-09-17 DIAGNOSIS — I7121 Aneurysm of the ascending aorta, without rupture: Secondary | ICD-10-CM | POA: Diagnosis not present

## 2021-09-17 LAB — ECHOCARDIOGRAM COMPLETE
AR max vel: 3.4 cm2
AV Area VTI: 3.24 cm2
AV Mean grad: 9.1 mmHg
AV Peak grad: 16.3 mmHg
Ao pk vel: 2.02 m/s
Area-P 1/2: 3.03 cm2
S' Lateral: 2.4 cm

## 2021-09-19 ENCOUNTER — Other Ambulatory Visit: Payer: Self-pay

## 2021-09-19 MED ORDER — CEPHALEXIN 500 MG PO CAPS: 500.0000 mg | ORAL_CAPSULE | Freq: Three times a day (TID) | ORAL | 0 refills | Status: AC

## 2021-09-19 NOTE — Progress Notes (Signed)
Patient contacted the office stating that he had a internal suture protruding from the lower half of his sternal incision. He clipped the suture a couple days ago because it was rubbing against his shirt. Stated that it was swollen last night but did not mess with it again. States that he woke up this morning and it had "burst". He has a small opening where the swelling was. Spoke with [name], PA who prescribed Keflex 500mg  TID x7 days and patient is to follow-up in clinic on Monday 7/17. Patient aware and acknowledged receipt.

## 2021-09-20 ENCOUNTER — Telehealth (HOSPITAL_COMMUNITY): Payer: Self-pay

## 2021-09-20 ENCOUNTER — Telehealth: Payer: Self-pay | Admitting: *Deleted

## 2021-09-20 DIAGNOSIS — I3139 Other pericardial effusion (noninflammatory): Secondary | ICD-10-CM

## 2021-09-20 MED ORDER — COLCHICINE 0.6 MG PO TABS: 0.6000 mg | ORAL_TABLET | Freq: Two times a day (BID) | ORAL | 0 refills | Status: DC

## 2021-09-20 MED ORDER — COLCHICINE 0.6 MG PO TABS: 0.6000 mg | ORAL_TABLET | Freq: Every day | ORAL | 0 refills | Status: DC

## 2021-09-20 NOTE — Telephone Encounter (Signed)
Pt returned phone call and stated that he is interested in the cardiac rehab program. I advised pt that we will contact him at a later date for scheduling, advised pt of the 1-2 month waitlist.

## 2021-09-20 NOTE — Telephone Encounter (Signed)
Spoke with patient.  He will pick up and start colchicine 0.6 mg BID and I have scheduled his echo for 10/18/21.  He tells me he saw a stitch come up on his incision and so he snipped it.  A dime sized area raised up and opened/popped overnight.  Yesterday he sent pictures to [facility].  He has been started on Kelfex.  The area is open to air and slightly scabbed.  The surgery office is seeing him on Monday.

## 2021-09-20 NOTE — Telephone Encounter (Signed)
-----   Message from Kathleene Hazel, MD sent at 09/19/2021  7:33 AM EDT ----- Nani Skillern, His heart is strong. He does have a small to moderate pericardial effusion post op. I spoke to his CT surgeon and this is to be expected after this type of surgery. He is doing well clinically. I would like to start him on colchicine 0.6 mg po BID for 4 weeks (anti-inflammatory). Repeat limited echo in one month to look at his effusion. [place]

## 2021-09-20 NOTE — Addendum Note (Signed)
Addended by: Lendon Ka on: 09/20/2021 06:00 PM   Modules accepted: Orders

## 2021-09-23 ENCOUNTER — Encounter: Payer: Self-pay | Admitting: Physician Assistant

## 2021-09-23 ENCOUNTER — Ambulatory Visit: Payer: Self-pay | Admitting: Physician Assistant

## 2021-09-23 VITALS — BP 145/88 | HR 74 | Resp 20 | Ht 72.0 in | Wt 257.8 lb

## 2021-09-23 DIAGNOSIS — Z8679 Personal history of other diseases of the circulatory system: Secondary | ICD-10-CM

## 2021-09-23 MED ORDER — LISINOPRIL 5 MG PO TABS: 5.0000 mg | ORAL_TABLET | Freq: Every day | ORAL | 1 refills | Status: DC

## 2021-09-23 NOTE — Progress Notes (Addendum)
      [address]       [place] [address]             [phone]       HPI:  Patient had contacted the office on 07/13 because of an exposed suture, drainage from lower sternal wound. He was given a 7 day course of Keflex.   He presents today for further sternal wound evaluation. Patient states sternal wound is much improved. Current Outpatient Medications  Medication Sig Dispense Refill   acetaminophen (TYLENOL) 500 MG tablet Take 1,000 mg by mouth every 6 (six) hours as needed for mild pain.     amLODipine (NORVASC) 10 MG tablet Take 10 mg by mouth daily.     aspirin EC 81 MG tablet Take 1 tablet (81 mg total) by mouth daily. Swallow whole. (Patient taking differently: Take 81 mg by mouth daily.) 30 tablet 12   cephALEXin (KEFLEX) 500 MG capsule Take 1 capsule (500 mg total) by mouth 3 (three) times daily for 7 days. 21 capsule 0   colchicine 0.6 MG tablet Take 1 tablet (0.6 mg total) by mouth 2 (two) times daily. 60 tablet 0   furosemide (LASIX) 40 MG tablet Take 1 tablet (40 mg total) by mouth daily. 7 tablet 0   potassium chloride (KLOR-CON) 10 MEQ tablet Take 2 tablets (20 mEq total) by mouth daily. 60 tablet 0  Vital Signs: Vitals:   09/23/21 1438  BP: (!) 145/88  Pulse: 74  Resp: 20  SpO2: 96%      Physical Exam CV-RRR Pulmonary-Clear to auscultation on right and slightly diminished left base Extremities-Trace LE edema Wound-Clean and dry. No surrounding erythema.   Impression and Plan: Patient instructed to finish Keflex. We discussed his elevated blood pressure. He had been on Lisinopril at one time. I put through Epic a prescription for Lisinopril 5 mg daily. Patient will keep a record of BP and bring to his next appointment. Also, patient was found to have a small to moderate pericardial effusion on echo on 09/17/2021. He was put on Colchicine 0.6 mg bid. He already has a follow up appointment to see Dr. Laneta Simmers on 10/09/2021 as well as an echo  on 10/18/2021 (to re evaluate size of pericardial effusion).    Ardelle Balls, PA-C [facility] [phone]

## 2021-09-27 ENCOUNTER — Other Ambulatory Visit: Payer: Self-pay | Admitting: Surgery

## 2021-10-02 ENCOUNTER — Telehealth: Payer: Self-pay | Admitting: Cardiovascular Disease

## 2021-10-02 NOTE — Telephone Encounter (Signed)
Spoke to pt and pharmacy. Informed pharmacy that pt is no longer taking Amiodarone, stopped earlier this month. Also informed them, per pt, that the Lasix they are sending him notifications to contact provider for refills was only a temporary medication and they don't need to send him notifications about reaching out to the provider for refills. They will remove both medications from his list.

## 2021-10-02 NOTE — Telephone Encounter (Signed)
New Message:      Pharmacist called and wanted Dr Clifton James to know that there is an interaction between Colchicine and Amiodarone that patient is taking. Does Dr Clifton James still want patient to take Colchicine?

## 2021-10-08 ENCOUNTER — Other Ambulatory Visit: Payer: Self-pay | Admitting: Surgery

## 2021-10-08 DIAGNOSIS — Z8679 Personal history of other diseases of the circulatory system: Secondary | ICD-10-CM

## 2021-10-09 ENCOUNTER — Ambulatory Visit (INDEPENDENT_AMBULATORY_CARE_PROVIDER_SITE_OTHER): Payer: Self-pay | Admitting: Surgery

## 2021-10-09 ENCOUNTER — Ambulatory Visit
Admission: RE | Admit: 2021-10-09 | Discharge: 2021-10-09 | Disposition: A | Discharge status: Home / Self Care | Payer: Managed Care, Other (non HMO) | Source: Ambulatory Visit | Attending: Surgery | Admitting: Surgery

## 2021-10-09 VITALS — BP 145/84 | HR 64 | Resp 18 | Ht 72.0 in | Wt 260.0 lb

## 2021-10-09 DIAGNOSIS — Z8679 Personal history of other diseases of the circulatory system: Secondary | ICD-10-CM

## 2021-10-09 DIAGNOSIS — Z9889 Other specified postprocedural states: Secondary | ICD-10-CM

## 2021-10-09 DIAGNOSIS — I350 Nonrheumatic aortic (valve) stenosis: Secondary | ICD-10-CM

## 2021-10-09 DIAGNOSIS — I712 Thoracic aortic aneurysm, without rupture, unspecified: Secondary | ICD-10-CM

## 2021-10-09 NOTE — Progress Notes (Signed)
HPI:  Patient returns for routine postoperative follow-up having undergone replacement of the ascending aorta using a 38 mm Hemashield graft under deep hypothermic circulatory arrest and Bentall procedure using a 27 mm KONECT Resilia pericardial valved graft on 08/09/2021.  He had a follow-up 2D echocardiogram on 09/17/2021 which showed a moderate size pericardial effusion mostly lateral to the left ventricle.  There is no evidence of tamponade.  The prosthetic aortic valve is functioning normally and left ventricular ejection fraction is 50 to 55%.  He was seen by cardiology and started on colchicine for 4 weeks which she is currently taking.  He has continued to feel well without chest pain or shortness of breath.  His peripheral edema has resolved.  He has been increasing his activity with good stamina.  Current Outpatient Medications  Medication Sig Dispense Refill   acetaminophen (TYLENOL) 500 MG tablet Take 1,000 mg by mouth every 6 (six) hours as needed for mild pain.     amLODipine (NORVASC) 10 MG tablet Take 10 mg by mouth daily.     aspirin EC 81 MG tablet Take 1 tablet (81 mg total) by mouth daily. Swallow whole. (Patient taking differently: Take 81 mg by mouth daily.) 30 tablet 12   colchicine 0.6 MG tablet Take 1 tablet (0.6 mg total) by mouth 2 (two) times daily. 60 tablet 0   lisinopril (ZESTRIL) 5 MG tablet Take 1 tablet (5 mg total) by mouth daily. 30 tablet 1   No current facility-administered medications for this visit.     Physical Exam: BP (!) 145/84 (BP Location: Right Arm, Patient Position: Sitting)   Pulse 64   Resp 18   Ht 6' (1.829 m)   Wt 260 lb (117.9 kg)   SpO2 95% Comment: RA  BMI 35.26 kg/m  He looks well. Cardiac exam shows a regular rate and rhythm with normal heart sounds.  There is no murmur. Lungs are clear. The chest incision is well-healed and the sternum is stable. There is no peripheral edema.  Diagnostic Tests:  Narrative & Impression   CLINICAL DATA:  Status post ascending aortic aneurysm repair.   EXAM: CHEST - 2 VIEW   COMPARISON:  Radiograph September 04, 2021   FINDINGS: Decreased enlargement of the cardiac silhouette with similar prominence of the vascular pedicle. Aortic valve prosthesis. No focal airspace consolidation. No visible pleural effusion or pneumothorax. Prior median sternotomy.   IMPRESSION: Decreased enlargement of the cardiac silhouette with similar widening of the vascular pedicle.   No focal airspace consolidation and no visible pleural effusion or pneumothorax.     Electronically Signed   By: Maudry Mayhew M.D.   On: 10/09/2021 09:05      Impression: Overall he is doing well 2 months following his surgery.  He feels well.  His chest x-ray today shows resolution of his pleural effusions with clear lung fields.  The cardiac silhouette looks back to normal to me.  I encouraged him to continue increasing his activity and to watch his diet closely.  I advised him that continued weight loss will be in his best interest.  I asked him not to lift anything heavier than 10 pounds for 3 months postoperatively.  Plan:  He has a follow-up echocardiogram scheduled after 1 month of colchicine treatment and will see cardiology at that time.  I will plan to see him back at 1 year postoperatively for a routine surveillance CTA of the chest to follow-up on the remainder of his aorta.  Gaye Pollack, MD [facility] [phone]

## 2021-10-11 ENCOUNTER — Encounter: Payer: Self-pay | Admitting: Surgery

## 2021-10-12 ENCOUNTER — Other Ambulatory Visit: Payer: Self-pay | Admitting: Cardiovascular Disease

## 2021-10-15 ENCOUNTER — Other Ambulatory Visit: Payer: Self-pay

## 2021-10-15 MED ORDER — AMLODIPINE BESYLATE 10 MG PO TABS: 10.0000 mg | ORAL_TABLET | Freq: Every day | ORAL | 3 refills | Status: DC

## 2021-10-15 NOTE — Telephone Encounter (Signed)
Pt's medication was sent to pt's pharmacy as requested. Confirmation received.  °

## 2021-10-15 NOTE — Telephone Encounter (Signed)
Pt's pharmacy is requesting a refill on colchicine. Would Dr. Clifton James like to refill this medication? Please address

## 2021-10-16 ENCOUNTER — Other Ambulatory Visit: Payer: Self-pay | Admitting: Physician Assistant

## 2021-10-16 NOTE — Telephone Encounter (Signed)
Will hold on refilling colchicine at this time.  Was prescribed for 1 month w echo after that.  Echo is scheduled 10/18/21.

## 2021-10-18 ENCOUNTER — Telehealth: Payer: Self-pay

## 2021-10-18 ENCOUNTER — Ambulatory Visit (HOSPITAL_COMMUNITY): Payer: Managed Care, Other (non HMO) | Attending: Cardiovascular Disease

## 2021-10-18 DIAGNOSIS — I7121 Aneurysm of the ascending aorta, without rupture: Secondary | ICD-10-CM

## 2021-10-18 DIAGNOSIS — I3139 Other pericardial effusion (noninflammatory): Secondary | ICD-10-CM | POA: Diagnosis present

## 2021-10-18 LAB — ECHOCARDIOGRAM COMPLETE
AR max vel: 2.3 cm2
AV Area VTI: 2.68 cm2
AV Area mean vel: 2.08 cm2
AV Mean grad: 8.1 mmHg
AV Peak grad: 14.3 mmHg
Ao pk vel: 1.89 m/s
Area-P 1/2: 3.39 cm2
S' Lateral: 2.5 cm

## 2021-10-18 MED ORDER — LISINOPRIL 10 MG PO TABS: 10.0000 mg | ORAL_TABLET | Freq: Every day | ORAL | 3 refills | Status: DC

## 2021-10-18 NOTE — Addendum Note (Signed)
Addended by: Theresia Majors on: 10/18/2021 02:28 PM   Modules accepted: Orders

## 2021-10-18 NOTE — Telephone Encounter (Signed)
Spoke with the patient and advised him that he can stop taking amlodipine and increase lisinopril to 10 mg daily. He will come in for labs next week on 8/18. Patient is going to go ahead and get his cholesterol checked at this time as well. This was originally scheduled for September.

## 2021-10-18 NOTE — Telephone Encounter (Signed)
The patient has been notified of the result and verbalized understanding.  All questions (if any) were answered. Theresia Majors, RN 10/18/2021 1:21 PM   Patient states that he went to pick up a refill on his amlodipine earlier last weekend and it was going to cost him $50. He states this is too expensive for him so he did not pick it up so has not been taking it. He would like to know if he needs to get back on it and try and find it for cheaper or if he could just increase his dose of lisinopril for blood pressure control. He prefers to be on less medications. States that he talked with Dr. Laneta Simmers about this who advised him to reach out to Dr. Clifton James.

## 2021-10-18 NOTE — Telephone Encounter (Signed)
-----   Message from Kathleene Hazel, MD sent at 10/18/2021  1:13 PM EDT ----- Follow up echo to assess post op pericardial effusion. The echo looks great. The effusion is resolved. His AVR is working well.His heart is strong. cdm

## 2021-10-25 ENCOUNTER — Other Ambulatory Visit: Payer: Managed Care, Other (non HMO)

## 2021-10-25 DIAGNOSIS — I7121 Aneurysm of the ascending aorta, without rupture: Secondary | ICD-10-CM

## 2021-10-25 LAB — BASIC METABOLIC PANEL
BUN/Creatinine Ratio: 19 (ref 9–20)
BUN: 19 mg/dL (ref 6–24)
CO2: 22 mmol/L (ref 20–29)
Calcium: 9.5 mg/dL (ref 8.7–10.2)
Chloride: 103 mmol/L (ref 96–106)
Creatinine, Ser: 1 mg/dL (ref 0.76–1.27)
Glucose: 105 mg/dL — ABNORMAL HIGH (ref 70–99)
Potassium: 4.8 mmol/L (ref 3.5–5.2)
Sodium: 138 mmol/L (ref 134–144)
eGFR: 92 mL/min/{1.73_m2} (ref >59)

## 2021-10-28 ENCOUNTER — Encounter: Payer: Self-pay | Admitting: *Deleted

## 2021-10-29 ENCOUNTER — Other Ambulatory Visit: Payer: Self-pay | Admitting: Cardiovascular Disease

## 2021-12-06 ENCOUNTER — Other Ambulatory Visit: Payer: Managed Care, Other (non HMO)

## 2021-12-17 NOTE — Progress Notes (Unsigned)
No chief complaint on file.  History of Present Illness: 51 yo male with history of HTN, dilated ascending aorta and bicuspid aortic valve with moderate aortic stenosis who is here today for cardiac follow up. I saw him as a new patient for the evaluation of aortic stenosis in March 2023. Echo January 2023 at [facility] center with LVEF=55-60%. The aortic valve is noted to be bicuspid. Moderate aortic stenosis with mean gradient 28 mmHg. Dilated ascending aorta. He was feeling well.  Chest CTA with dilated ascending aorta at 5.2 cm. He was seen initially by Dr. Cliffton Asters in the CT surgery office. Surgery was discussed. He was then seen in the ED with chest pain on 07/11/21 and repeat chest CTA May 2023 with dilated ascending aorta at 5.4 cm. Cardiac cath 07/26/21 with no angiographic evidence of CAD. He was seen by Dr. Laneta Simmers in the CT surgery office and underwent surgical AVR and aortic root replacement 08/09/21.  Post operative echo with pericardial effusion. Echo 10/18/21 with LVEF=60-65%. Trivial Mr. Normally functioning AVR.   He is here today for follow up. The patient denies any chest pain, dyspnea, palpitations, lower extremity edema, orthopnea, PND, dizziness, near syncope or syncope.    Primary Care Physician: Shellia Cleverly, PA  Past Medical History:  Diagnosis Date   Aortic stenosis    diagnosed at age 55   Heart murmur    History of chicken pox    Hypertension    Hypogonadism male    Low serum testosterone    Non-recurrent bilateral inguinal hernia    PONV (postoperative nausea and vomiting)    Past Surgical History:  Procedure Laterality Date   BENTALL PROCEDURE N/A 08/09/2021   Procedure: BENTALL PROCEDURE USING Konect RESILIA  AORTIC VALVED CONDUIT;  Surgeon: [name], MD;  Location: [facility] OR;  Service: Open Heart Surgery;  Laterality: N/A;  CIR[facility] ARREST   KNEE SURGERY Right    age 19s. x2   LEFT HEART CATH AND CORONARY ANGIOGRAPHY N/A 07/26/2021   Procedure:  LEFT HEART CATH AND CORONARY ANGIOGRAPHY;  Surgeon: Kathleene Hazel, MD;  Location: [facility] INVASIVE CV LAB;  Service: Cardiovascular;  Laterality: N/A;   TEE WITHOUT CARDIOVERSION N/A 08/09/2021   Procedure: TRANSESOPHAGEAL ECHOCARDIOGRAM (TEE);  Surgeon: [name], MD;  Location: [facility] OR;  Service: Open Heart Surgery;  Laterality: N/A;   THORACIC AORTIC ANEURYSM REPAIR N/A 08/09/2021   Procedure: REPLACEMENT OF ASCENDING AORTIC ANEURYSM USING HEMASHIELD PLATINUM VASCULAR GRAFT;  Surgeon: [name], MD;  Location: [facility] OR;  Service: Open Heart Surgery;  Laterality: N/A;    Current Outpatient Medications  Medication Sig Dispense Refill   acetaminophen (TYLENOL) 500 MG tablet Take 1,000 mg by mouth every 6 (six) hours as needed for mild pain.     aspirin EC 81 MG tablet Take 1 tablet (81 mg total) by mouth daily. Swallow whole. (Patient taking differently: Take 81 mg by mouth daily.) 30 tablet 12   lisinopril (ZESTRIL) 10 MG tablet Take 1 tablet (10 mg total) by mouth daily. 90 tablet 3   No current facility-administered medications for this visit.    Allergies  Allergen Reactions   Bee Venom Anaphylaxis   Other Anaphylaxis    Wasps Yellow Jackets    Motrin [Ibuprofen] Nausea And Vomiting and Other (See Comments)    GI Upset Ok if eat something before    Social History   Socioeconomic History   Marital status: Married  Spouse name: Not on file   Number of children: 3   Years of education: Not on file   Highest education level: Not on file  Occupational History   Occupation: Psychologist, sport and exercise  Tobacco Use   Smoking status: Never   Smokeless tobacco: Never  Vaping Use   Vaping Use: Never used  Substance and Sexual Activity   Alcohol use: No   Drug use: No   Sexual activity: Not on file  Other Topics Concern   Not on file  Social History Narrative   Some college, Chiropodist   Married    3 kids   Social Determinants of Health   Financial Resource  Strain: Not on file  Food Insecurity: Not on file  Transportation Needs: Not on file  Physical Activity: Not on file  Stress: Not on file  Social Connections: Not on file  Intimate Partner Violence: Not on file    Family History  Problem Relation Age of Onset   Arthritis Other    Cancer Other        Breast   Hyperlipidemia Other    Heart disease Other     Review of Systems:  As stated in the HPI and otherwise negative.   There were no vitals taken for this visit.  Physical Examination: General: Well developed, well nourished, NAD  HEENT: OP clear, mucus membranes moist  SKIN: warm, dry. No rashes. Neuro: No focal deficits  Musculoskeletal: Muscle strength 5/5 all ext  Psychiatric: Mood and affect normal  Neck: No JVD, no carotid bruits, no thyromegaly, no lymphadenopathy.  Lungs:Clear bilaterally, no wheezes, rhonci, crackles Cardiovascular: Regular rate and rhythm. No murmurs, gallops or rubs. Abdomen:Soft. Bowel sounds present. Non-tender.  Extremities: No lower extremity edema. Pulses are 2 + in the bilateral DP/PT.   EKG:  EKG is *** ordered today. The ekg ordered today demonstrates  Echo August 2023:   1. Left ventricular ejection fraction, by estimation, is 60 to 65%. Left  ventricular ejection fraction by 3D volume is 65 %. The left ventricle has  normal function. The left ventricle has no regional wall motion  abnormalities. Left ventricular diastolic   parameters were normal.   2. Right ventricular systolic function is low normal. The right  ventricular size is mildly enlarged. The estimated right ventricular  systolic pressure is 25.1 mmHg.   3. Left atrial size was moderately dilated.   4. Right atrial size was moderately dilated.   5. The mitral valve is normal in structure. Trivial mitral valve  regurgitation.   6. The aortic valve has been repaired/replaced. Aortic valve  regurgitation is not visualized. No aortic stenosis is present. There is a   bioprosthetic valve present in the aortic position. Aortic valve mean  gradient measures 8.1 mmHg. Aortic valve Vmax  measures 1.89 m/s. Aortic valve acceleration time measures 53 msec.   7. Aortic root/ascending aorta has been repaired/replaced. There is  borderline dilatation of the aortic root, measuring 39 mm.   Recent Labs: 08/07/2021: ALT 39 08/10/2021: Magnesium 2.2 08/20/2021: Hemoglobin 11.4; Platelets 514 10/25/2021: BUN 19; Creatinine, Ser 1.00; Potassium 4.8; Sodium 138   Lipid Panel No results found for: "CHOL", "TRIG", "HDL", "CHOLHDL", "VLDL", "LDLCALC", "LDLDIRECT"   Wt Readings from Last 3 Encounters:  10/09/21 260 lb (117.9 kg)  09/23/21 257 lb 12.8 oz (116.9 kg)  09/11/21 257 lb (116.6 kg)     Assessment and Plan:   1. Bicuspid aortic valve stenosis/Thoracic aortic aneurysm: He is s/p AVR and  aortic root replacement June 2023. He is doing well. Will continue ASA and Lisinopril. BP is well controlled.    Labs/ tests ordered today include:  No orders of the defined types were placed in this encounter.  Disposition:   F/U with me after his surgical procedure   Signed, Lauree Chandler, MD 12/17/2021 8:54 PM    [address], [place], [place]  [address] Phone: [phone]; Fax: [phone]

## 2021-12-18 ENCOUNTER — Ambulatory Visit: Payer: Managed Care, Other (non HMO) | Attending: Cardiovascular Disease | Admitting: Cardiovascular Disease

## 2021-12-18 ENCOUNTER — Encounter: Payer: Self-pay | Admitting: Cardiovascular Disease

## 2021-12-18 VITALS — BP 150/80 | HR 83 | Ht 72.0 in | Wt 259.0 lb

## 2021-12-18 DIAGNOSIS — I7121 Aneurysm of the ascending aorta, without rupture: Secondary | ICD-10-CM

## 2021-12-18 DIAGNOSIS — Z9889 Other specified postprocedural states: Secondary | ICD-10-CM | POA: Diagnosis not present

## 2021-12-18 DIAGNOSIS — I350 Nonrheumatic aortic (valve) stenosis: Secondary | ICD-10-CM

## 2021-12-18 DIAGNOSIS — I10 Essential (primary) hypertension: Secondary | ICD-10-CM | POA: Diagnosis not present

## 2021-12-18 DIAGNOSIS — Z8679 Personal history of other diseases of the circulatory system: Secondary | ICD-10-CM

## 2021-12-18 NOTE — Patient Instructions (Signed)
Monitor BP at home call if you consistently get readings greater than 140/90.   Medication Instructions:  No changes *If you need a refill on your cardiac medications before your next appointment, please call your pharmacy*   Lab Work: none If you have labs (blood work) drawn today and your tests are completely normal, you will receive your results only by: [facility] (if you have MyChart) OR A paper copy in the mail If you have any lab test that is abnormal or we need to change your treatment, we will call you to review the results.   Testing/Procedures: none   Follow-Up: At [facility], you and your health needs are our priority.  As part of our continuing mission to provide you with exceptional heart care, we have created designated Provider Care Teams.  These Care Teams include your primary Cardiologist (physician) and Advanced Practice Providers (APPs -  Physician Assistants and Nurse Practitioners) who all work together to provide you with the care you need, when you need it.   Your next appointment:   12 month(s)  The format for your next appointment:   In Person  Provider:   Lauree Chandler, MD     Other Instructions Monitor BP at home call if you consistently get readings greater than 140/90.  Important Information About Sugar

## 2021-12-25 ENCOUNTER — Telehealth: Payer: Self-pay | Admitting: Cardiovascular Disease

## 2021-12-25 NOTE — Telephone Encounter (Signed)
Pt called back to obtain more information, and research this concern.    Pt stated on Friday, 12/20/2021 at 520 am, he was in his truck trying to back out of driveway. He states he completely lost vision in his left eye?  He covered his right eye, and could not see / only saw black.  Pt states this lasted for 10-15 minutes roughly.     Pt states no other symptoms during the vision loss event, and has not had this happen since.    Pt scheduled appointment, and went to see Dr. Camillo Flaming, Optometrist at [facility].  [phone])  Pt stated they were supposed to fax exam results to Dr. Angelena Form. Dr. Peter Garter thought cardiology would want to review their exam notes.  Pt mentioned he had recent Bentall procedure with Dr. Suella Broad on 08/09/21.  Pt advised to go to the nearest ER if this happens again, or becomes symptomatic another way after reviewing his PMH.    I will forward this to Dr. Angelena Form and team for their review.

## 2021-12-25 NOTE — Telephone Encounter (Signed)
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 He states it only lasted for about 15-30 mins and that he called his eye doctor and they suggested him reach out to his cardiologist due to possible blockages from 06/02 surgery. Eye doctor was suppose to be faxing results and exams they have taken. Please advise patient if there should be test or labs done he says. He has made appt with Dr. Julianne Handler 10/20 at 8 a.m.

## 2021-12-25 NOTE — Telephone Encounter (Signed)
Patient has appointment scheduled for Friday 10/20 with Dr. Angelena Form.

## 2021-12-26 NOTE — Progress Notes (Signed)
Chief Complaint  Patient presents with   Follow-up    Vision loss   History of Present Illness: 51 yo male with history of HTN, dilated ascending aorta and bicuspid aortic valve with moderate aortic stenosis who is here today for cardiac follow up. I saw him as a new patient for the evaluation of aortic stenosis in March 2023. Echo January 2023 at [facility] center with LVEF=55-60%. The aortic valve is noted to be bicuspid. Moderate aortic stenosis with mean gradient 28 mmHg. Dilated ascending aorta. He was feeling well.  Chest CTA with dilated ascending aorta at 5.2 cm. He was seen initially by Dr. Cliffton Asters in the CT surgery office. Surgery was discussed. He was then seen in the ED with chest pain on 07/11/21 and repeat chest CTA May 2023 with dilated ascending aorta at 5.4 cm. Cardiac cath 07/26/21 with no angiographic evidence of CAD. He was seen by Dr. Laneta Simmers in the CT surgery office and underwent surgical AVR and aortic root replacement 08/09/21.  Post operative echo with pericardial effusion. Echo 10/18/21 with LVEF=60-65%. Trivial MR. Normally functioning AVR.   He called our office yesterday after having an episode where he lost vision in his left eye for 15 mniutes He was seen in the ophthalmology clinic and was told his eye exam was normal but he needed to see cardiology to see if we had any ideas. Carotid dopplers in pre CABG testing with no significant plaque. The patient denies any chest pain, dyspnea, palpitations, lower extremity edema, orthopnea, PND, dizziness, near syncope or syncope. His vision has been normal over the past week. This event happened one week ago.   Primary Care Physician: Shellia Cleverly, PA  Past Medical History:  Diagnosis Date   Aortic stenosis    diagnosed at age 67   Heart murmur    History of chicken pox    Hypertension    Hypogonadism male    Low serum testosterone    Non-recurrent bilateral inguinal hernia    PONV (postoperative nausea and  vomiting)    Past Surgical History:  Procedure Laterality Date   BENTALL PROCEDURE N/A 08/09/2021   Procedure: BENTALL PROCEDURE USING Konect RESILIA  AORTIC VALVED CONDUIT;  Surgeon: [name], MD;  Location: [facility] OR;  Service: Open Heart Surgery;  Laterality: N/A;  CIRC ARREST   KNEE SURGERY Right    age 60s. x2   LEFT HEART CATH AND CORONARY ANGIOGRAPHY N/A 07/26/2021   Procedure: LEFT HEART CATH AND CORONARY ANGIOGRAPHY;  Surgeon: Kathleene Hazel, MD;  Location: [facility] INVASIVE CV LAB;  Service: Cardiovascular;  Laterality: N/A;   TEE WITHOUT CARDIOVERSION N/A 08/09/2021   Procedure: TRANSESOPHAGEAL ECHOCARDIOGRAM (TEE);  Surgeon: [name], MD;  Location: [facility] OR;  Service: Open Heart Surgery;  Laterality: N/A;   THORACIC AORTIC ANEURYSM REPAIR N/A 08/09/2021   Procedure: REPLACEMENT OF ASCENDING AORTIC ANEURYSM USING HEMASHIELD PLATINUM VASCULAR GRAFT;  Surgeon: [name], MD;  Location: [facility] OR;  Service: Open Heart Surgery;  Laterality: N/A;    Current Outpatient Medications  Medication Sig Dispense Refill   aspirin EC 81 MG tablet Take 1 tablet (81 mg total) by mouth daily. Swallow whole. 30 tablet 12   lisinopril (ZESTRIL) 10 MG tablet Take 1 tablet (10 mg total) by mouth daily. 90 tablet 3   Testosterone 12.5 MG/ACT (1%) GEL 4 pump Topical Daily     No current facility-administered medications for this visit.    Allergies  Allergen Reactions   Bee Venom Anaphylaxis   Other Anaphylaxis    Wasps Yellow Jackets    Motrin [Ibuprofen] Nausea And Vomiting and Other (See Comments)    GI Upset Ok if eat something before    Social History   Socioeconomic History   Marital status: Married    Spouse name: Not on file   Number of children: 3   Years of education: Not on file   Highest education level: Not on file  Occupational History   Occupation: Psychologist, sport and exercise  Tobacco Use   Smoking status: Never   Smokeless tobacco: Never  Vaping Use   Vaping  Use: Never used  Substance and Sexual Activity   Alcohol use: No   Drug use: No   Sexual activity: Not on file  Other Topics Concern   Not on file  Social History Narrative   Some college, Chiropodist   Married    3 kids   Social Determinants of Health   Financial Resource Strain: Not on file  Food Insecurity: Not on file  Transportation Needs: Not on file  Physical Activity: Not on file  Stress: Not on file  Social Connections: Not on file  Intimate Partner Violence: Not on file    Family History  Problem Relation Age of Onset   Arthritis Other    Cancer Other        Breast   Hyperlipidemia Other    Heart disease Other     Review of Systems:  As stated in the HPI and otherwise negative.   BP 138/84   Pulse 69   Ht 6' (1.829 m)   Wt 257 lb 9.6 oz (116.8 kg)   SpO2 98%   BMI 34.94 kg/m   Physical Examination: General: Well developed, well nourished, NAD  HEENT: OP clear, mucus membranes moist  SKIN: warm, dry. No rashes. Neuro: No focal deficits  Musculoskeletal: Muscle strength 5/5 all ext  Psychiatric: Mood and affect normal  Neck: No JVD, no carotid bruits, no thyromegaly, no lymphadenopathy.  Lungs:Clear bilaterally, no wheezes, rhonci, crackles Cardiovascular: Regular rate and rhythm. No murmurs, gallops or rubs. Abdomen:Soft. Bowel sounds present. Non-tender.  Extremities: No lower extremity edema. Pulses are 2 + in the bilateral DP/PT.  EKG:  EKG is ordered today. The ekg ordered today demonstrates  Echo August 2023:  1. Left ventricular ejection fraction, by estimation, is 60 to 65%. Left  ventricular ejection fraction by 3D volume is 65 %. The left ventricle has  normal function. The left ventricle has no regional wall motion  abnormalities. Left ventricular diastolic   parameters were normal.   2. Right ventricular systolic function is low normal. The right  ventricular size is mildly enlarged. The estimated right ventricular  systolic  pressure is 25.1 mmHg.   3. Left atrial size was moderately dilated.   4. Right atrial size was moderately dilated.   5. The mitral valve is normal in structure. Trivial mitral valve  regurgitation.   6. The aortic valve has been repaired/replaced. Aortic valve  regurgitation is not visualized. No aortic stenosis is present. There is a  bioprosthetic valve present in the aortic position. Aortic valve mean  gradient measures 8.1 mmHg. Aortic valve Vmax  measures 1.89 m/s. Aortic valve acceleration time measures 53 msec.   7. Aortic root/ascending aorta has been repaired/replaced. There is  borderline dilatation of the aortic root, measuring 39 mm.   Recent Labs: 08/07/2021: ALT 39 08/10/2021: Magnesium 2.2 08/20/2021: Hemoglobin 11.4;  Platelets 514 10/25/2021: BUN 19; Creatinine, Ser 1.00; Potassium 4.8; Sodium 138   Lipid Panel No results found for: "CHOL", "TRIG", "HDL", "CHOLHDL", "VLDL", "LDLCALC", "LDLDIRECT"   Wt Readings from Last 3 Encounters:  12/27/21 257 lb 9.6 oz (116.8 kg)  12/18/21 259 lb (117.5 kg)  10/09/21 260 lb (117.9 kg)     Assessment and Plan:   1. Bicuspid aortic valve stenosis/Thoracic aortic aneurysm: He is s/p AVR and aortic root replacement June 2023. Continue ASA and Lisinopril. BP controlled.   2. HTN: BP is controlled. No changes  3. Vision loss: Unclear etiology but possibility this was a cardio embolic event. Recent normal carotid artery dopplers prior to his surgery. Normal LV function by echo August 2023. No palpitations to suggest atrial fib. Sinus today and at all follow visits after surgery. I would consider a cardiac monitor if he has any further vision changes. Will arrange a limited echo to assess his aortic valve and exclude vegetations.    Labs/ tests ordered today include:   Orders Placed This Encounter  Procedures   ECHOCARDIOGRAM LIMITED   Disposition:   F/U with in one year.    Signed, Lauree Chandler, MD 12/27/2021 10:11 AM     [facility] [place], [place], [place]  [address] Phone: [phone]; Fax: [phone]

## 2021-12-27 ENCOUNTER — Ambulatory Visit: Payer: Managed Care, Other (non HMO) | Attending: Cardiovascular Disease | Admitting: Cardiovascular Disease

## 2021-12-27 ENCOUNTER — Encounter: Payer: Self-pay | Admitting: Cardiovascular Disease

## 2021-12-27 VITALS — BP 138/84 | HR 69 | Ht 72.0 in | Wt 257.6 lb

## 2021-12-27 DIAGNOSIS — I10 Essential (primary) hypertension: Secondary | ICD-10-CM

## 2021-12-27 DIAGNOSIS — I350 Nonrheumatic aortic (valve) stenosis: Secondary | ICD-10-CM

## 2021-12-27 DIAGNOSIS — I7121 Aneurysm of the ascending aorta, without rupture: Secondary | ICD-10-CM

## 2021-12-27 NOTE — Patient Instructions (Addendum)
Medication Instructions:  No changes *If you need a refill on your cardiac medications before your next appointment, please call your pharmacy*   Lab Work: none If you have labs (blood work) drawn today and your tests are completely normal, you will receive your results only by: [place] (if you have MyChart) OR A paper copy in the mail If you have any lab test that is abnormal or we need to change your treatment, we will call you to review the results.   Testing/Procedures: Your physician has requested that you have an echocardiogram. Echocardiography is a painless test that uses sound waves to create images of your heart. It provides your doctor with information about the size and shape of your heart and how well your heart's chambers and valves are working. This procedure takes approximately one hour. There are no restrictions for this procedure. Please do NOT wear cologne, perfume, aftershave, or lotions (deodorant is allowed). Please arrive 15 minutes prior to your appointment time.   Follow-Up: At [facility], you and your health needs are our priority.  As part of our continuing mission to provide you with exceptional heart care, we have created designated Provider Care Teams.  These Care Teams include your primary Cardiologist (physician) and Advanced Practice Providers (APPs -  Physician Assistants and Nurse Practitioners) who all work together to provide you with the care you need, when you need it.  We recommend signing up for the patient portal called "MyChart".  Sign up information is provided on this After Visit Summary.  MyChart is used to connect with patients for Virtual Visits (Telemedicine).  Patients are able to view lab/test results, encounter notes, upcoming appointments, etc.  Non-urgent messages can be sent to your provider as well.   To learn more about what you can do with MyChart, go to [email or web address].    Your next appointment:   12  month(s) as planned  The format for your next appointment:   In Person  Provider:   Lauree Chandler, MD     Other Instructions   Important Information About Sugar

## 2022-01-08 ENCOUNTER — Ambulatory Visit (HOSPITAL_COMMUNITY): Payer: Managed Care, Other (non HMO) | Attending: Cardiovascular Disease

## 2022-01-08 DIAGNOSIS — I10 Essential (primary) hypertension: Secondary | ICD-10-CM | POA: Insufficient documentation

## 2022-01-08 DIAGNOSIS — I350 Nonrheumatic aortic (valve) stenosis: Secondary | ICD-10-CM | POA: Insufficient documentation

## 2022-01-08 DIAGNOSIS — I7121 Aneurysm of the ascending aorta, without rupture: Secondary | ICD-10-CM | POA: Diagnosis not present

## 2022-01-08 LAB — ECHOCARDIOGRAM LIMITED
AR max vel: 2.52 cm2
AV Area VTI: 3.1 cm2
AV Area mean vel: 2.8 cm2
AV Mean grad: 7 mmHg
AV Peak grad: 14 mmHg
Ao pk vel: 1.87 m/s
Area-P 1/2: 3.03 cm2
S' Lateral: 3.3 cm

## 2022-05-13 ENCOUNTER — Telehealth: Payer: Self-pay | Admitting: Cardiovascular Disease

## 2022-05-13 NOTE — Telephone Encounter (Signed)
I received a note from staff regarding the patient's chest pain symptoms.  On review of the patient's chart the patient is cared for by Dr. Angelena Form.  He underwent aortic valve replacement and a Bentall procedure in June 2023.  His coronary angiography study prior to his cardiac surgery demonstrated no obstructive coronary artery disease.  I did call the patient and I left a message regarding my concerns.  I asked him to proceed to the emergency department if he is having severe symptoms of chest pain.  Otherwise I will arrange for the patient to be seen in our office soon.

## 2022-05-13 NOTE — Telephone Encounter (Signed)
Pt called reporting sharp chest pain this happened several times today. He also experienced lightheadedness. Denies any other symptoms. Last night he had an coughing spell and vomited. Advised patient to go to the nearest emergency room due to his symptoms . He was kind of hesitant however, explained to the patient  why this is the best option. Patient voiced understanding. Will forward to MD and APP.

## 2022-05-13 NOTE — Telephone Encounter (Signed)
  Pt c/o of Chest Pain: STAT if CP now or developed within 24 hours  1. Are you having CP right now? No   2. Are you experiencing any other symptoms (ex. SOB, nausea, vomiting, sweating)? Lightheadedness   3. How long have you been experiencing CP? today  4. Is your CP continuous or coming and going? Coming and going  5. Have you taken Nitroglycerin? No   Pt said, he felt on and off sharp pain in his today and when he used the pulse oximeter his HR was 4 and oxygen 71%. He said, he did have some lightheadedness   ?

## 2022-05-14 NOTE — Telephone Encounter (Signed)
Called the pt he did received the providers voicemail, however, he did not go to the ED yesterday. Denies having chest pain or any other symptoms since yesterday afternoon. Explained Dr. Hassan Rowan recommendation:  I asked him to proceed to the emergency department if he is having severe symptoms of chest pain.   Patient voiced understanding. He is scheduled with APP on 3/13 @ 0800.

## 2022-05-19 DIAGNOSIS — R072 Precordial pain: Secondary | ICD-10-CM | POA: Insufficient documentation

## 2022-05-19 NOTE — Progress Notes (Deleted)
Cardiology Office Note Date:  05/19/2022  Patient ID:  Johnny Henderson, Johnny Henderson 1970-09-27, MRN [id number] PCP:  Manfred Shirts, PA  Cardiologist:  *** Electrophysiologist: ***  ***refresh   Chief Complaint: ***  History of Present Illness: Johnny Henderson is a 52 y.o. male with history of ***   Device information ***   Past Medical History:  Diagnosis Date   Aortic stenosis    diagnosed at age 73   Heart murmur    History of chicken pox    Hypertension    Hypogonadism male    Low serum testosterone    Non-recurrent bilateral inguinal hernia    PONV (postoperative nausea and vomiting)     Past Surgical History:  Procedure Laterality Date   BENTALL PROCEDURE N/A 08/09/2021   Procedure: BENTALL PROCEDURE USING 27MM Konect RESILIA  AORTIC VALVED CONDUIT;  Surgeon: Gaye Pollack, MD;  Location: [place];  Service: Open Heart Surgery;  Laterality: N/A;  CIRC ARREST   KNEE SURGERY Right    age 48s. x2   LEFT HEART CATH AND CORONARY ANGIOGRAPHY N/A 07/26/2021   Procedure: LEFT HEART CATH AND CORONARY ANGIOGRAPHY;  Surgeon: Burnell Blanks, MD;  Location: [facility] CV LAB;  Service: Cardiovascular;  Laterality: N/A;   TEE WITHOUT CARDIOVERSION N/A 08/09/2021   Procedure: TRANSESOPHAGEAL ECHOCARDIOGRAM (TEE);  Surgeon: Gaye Pollack, MD;  Location: [place];  Service: Open Heart Surgery;  Laterality: N/A;   THORACIC AORTIC ANEURYSM REPAIR N/A 08/09/2021   Procedure: REPLACEMENT OF ASCENDING AORTIC ANEURYSM USING 30MM HEMASHIELD PLATINUM VASCULAR GRAFT;  Surgeon: Gaye Pollack, MD;  Location: [place];  Service: Open Heart Surgery;  Laterality: N/A;    Current Outpatient Medications  Medication Sig Dispense Refill   aspirin EC 81 MG tablet Take 1 tablet (81 mg total) by mouth daily. Swallow whole. 30 tablet 12   lisinopril (ZESTRIL) 10 MG tablet Take 1 tablet (10 mg total) by mouth daily. 90 tablet 3   Testosterone 12.5 MG/ACT (1%) GEL 4 pump Topical Daily     No current  facility-administered medications for this visit.    Allergies:   Bee venom, Other, and Motrin [ibuprofen]   Social History:  The patient  reports that he has never smoked. He has never used smokeless tobacco. He reports that he does not drink alcohol and does not use drugs.   Family History:  The patient's family history includes Arthritis in an other family member; Cancer in an other family member; Heart disease in an other family member; Hyperlipidemia in an other family member.***  ROS:  Please see the history of present illness.    All other systems are reviewed and otherwise negative.   PHYSICAL EXAM:  VS:  There were no vitals taken for this visit. BMI: There is no height or weight on file to calculate BMI. Well nourished, well developed, in no acute distress HEENT: normocephalic, atraumatic Neck: no JVD, carotid bruits or masses Cardiac:  *** RRR; no significant murmurs, no rubs, or gallops Lungs:  *** CTA b/l, no wheezing, rhonchi or rales Abd: soft, nontender MS: no deformity or *** atrophy Ext: *** no edema Skin: warm and dry, no rash Neuro:  No gross deficits appreciated Psych: euthymic mood, full affect  *** PPM/ICD site is stable, no tethering or discomfort   EKG:  Done today and reviewed by myself shows  ***  Device interrogation done today and reviewed by myself:  ***  Recent Labs: 08/07/2021:  ALT 39 08/10/2021: Magnesium 2.2 08/20/2021: Hemoglobin 11.4; Platelets 514 10/25/2021: BUN 19; Creatinine, Ser 1.00; Potassium 4.8; Sodium 138  No results found for requested labs within last 365 days.   CrCl cannot be calculated (Patient's most recent lab result is older than the maximum 21 days allowed.).   Wt Readings from Last 3 Encounters:  12/27/21 257 lb 9.6 oz (116.8 kg)  12/18/21 259 lb (117.5 kg)  10/09/21 260 lb (117.9 kg)     Other studies reviewed: Additional studies/records reviewed today include: summarized above  ASSESSMENT AND  PLAN:  ***  Disposition: F/u with ***  Current medicines are reviewed at length with the patient today.  The patient did not have any concerns regarding medicines.  Venetia Night, PA-C 05/19/2022 7:10 AM     [facility] [address] [place] [address] [phone] (office)  [phone] (fax)

## 2022-05-19 NOTE — Progress Notes (Deleted)
  Cardiology Office Note:    Date:  05/19/2022  ID:  Johnny Henderson, DOB [birth year], MRN [id number] [facility] Cardiologist:  Lauree Chandler, MD { Click to update primary MD,subspecialty MD or APP then REFRESH:1}     Patient Profile:   Bicuspid AV, aortic stenosis; dilated ascending aorta  S/p Bentall with bioprosthetic AVR and aortic root replacement in 08/2021 (Dr. Cyndia Bent) Limited TTE 01/08/2022: EF 55-60, normal structure and function of AVR (mean gradient 7), stable measurement of aortic repair graft (30 mm), mild LVH, normal RVSF, normal PASP (RVSP 19.2), trivial MR, RAP 3 Hypertension  LHC 07/26/21: no CAD     History of Present Illness:   Johnny Henderson is a 52 y.o. male who presents for the evaluation of chest pain. He called the office last week and was asked to go to the emergency room for evaluation. ***  ROS ***    Studies Reviewed:    EKG:  ***  ***  Risk Assessment/Calculations:   {Does this patient have ATRIAL FIBRILLATION?[phone]} No BP recorded.  {Refresh Note OR Click here to enter BP  :1}***       Physical Exam:   VS:  There were no vitals taken for this visit.   Wt Readings from Last 3 Encounters:  12/27/21 257 lb 9.6 oz (116.8 kg)  12/18/21 259 lb (117.5 kg)  10/09/21 260 lb (117.9 kg)    Physical Exam***    ASSESSMENT AND PLAN:   No problem-specific Assessment & Plan notes found for this encounter. ***    {Are you ordering a CV Procedure (e.g. stress test, cath, DCCV, TEE, etc)?   Press F2        :[id number]}  Dispo:  No follow-ups on file. Signed, Richardson Dopp, PA-C

## 2022-05-20 ENCOUNTER — Ambulatory Visit: Payer: Managed Care, Other (non HMO) | Attending: Physician Assistant | Admitting: Physician Assistant

## 2022-05-20 ENCOUNTER — Ambulatory Visit: Payer: Managed Care, Other (non HMO) | Admitting: Physician Assistant

## 2022-05-20 ENCOUNTER — Encounter: Payer: Self-pay | Admitting: Physician Assistant

## 2022-05-20 ENCOUNTER — Telehealth: Payer: Self-pay | Admitting: Cardiovascular Disease

## 2022-05-20 VITALS — BP 136/90 | HR 64 | Ht 72.0 in | Wt 256.8 lb

## 2022-05-20 DIAGNOSIS — Z9889 Other specified postprocedural states: Secondary | ICD-10-CM

## 2022-05-20 DIAGNOSIS — R058 Other specified cough: Secondary | ICD-10-CM

## 2022-05-20 DIAGNOSIS — R072 Precordial pain: Secondary | ICD-10-CM | POA: Diagnosis not present

## 2022-05-20 DIAGNOSIS — I7121 Aneurysm of the ascending aorta, without rupture: Secondary | ICD-10-CM

## 2022-05-20 DIAGNOSIS — I350 Nonrheumatic aortic (valve) stenosis: Secondary | ICD-10-CM

## 2022-05-20 DIAGNOSIS — Z8679 Personal history of other diseases of the circulatory system: Secondary | ICD-10-CM | POA: Diagnosis not present

## 2022-05-20 DIAGNOSIS — I10 Essential (primary) hypertension: Secondary | ICD-10-CM

## 2022-05-20 MED ORDER — PANTOPRAZOLE SODIUM 40 MG PO TBEC: 40.0000 mg | DELAYED_RELEASE_TABLET | Freq: Every day | ORAL | 1 refills | Status: DC

## 2022-05-20 MED ORDER — LOSARTAN POTASSIUM 50 MG PO TABS: 50.0000 mg | ORAL_TABLET | Freq: Every day | ORAL | 3 refills | Status: DC

## 2022-05-20 NOTE — Assessment & Plan Note (Signed)
Blood pressure above target.  Question if he has an ACE inhibitor induced cough.  DC lisinopril.  Start losartan 50 mg daily.  Follow-up in 3 to 4 weeks.  Obtain BMET at that time.

## 2022-05-20 NOTE — Patient Instructions (Addendum)
Medication Instructions:  Your physician has recommended you make the following change in your medication:   STOP Lisinopril  START Losartan 50 taking 1 daily  START Protonix 40 mg taking 1 daily for 2 weeks then only as needed  *If you need a refill on your cardiac medications before your next appointment, please call your pharmacy*   Lab Work: None ordered  If you have labs (blood work) drawn today and your tests are completely normal, you will receive your results only by: [place] (if you have MyChart) OR A paper copy in the mail If you have any lab test that is abnormal or we need to change your treatment, we will call you to review the results.   Testing/Procedures: A chest x-ray takes a picture of the organs and structures inside the chest, including the heart, lungs, and blood vessels. This test can show several things, including, whether the heart is enlarges; whether fluid is building up in the lungs; and whether pacemaker / defibrillator leads are still in place.  Go to: [facility]  [address] [place], [place] [address]  TODAY BEFORE 4:30   Follow-Up: At [facility], you and your health needs are our priority.  As part of our continuing mission to provide you with exceptional heart care, we have created designated Provider Care Teams.  These Care Teams include your primary Cardiologist (physician) and Advanced Practice Providers (APPs -  Physician Assistants and Nurse Practitioners) who all work together to provide you with the care you need, when you need it.  We recommend signing up for the patient portal called "MyChart".  Sign up information is provided on this After Visit Summary.  MyChart is used to connect with patients for Virtual Visits (Telemedicine).  Patients are able to view lab/test results, encounter notes, upcoming appointments, etc.  Non-urgent messages can be sent to your provider as well.   To learn more about what you  can do with MyChart, go to [email or web address].    Your next appointment:   3-4  week(s)  Provider:   Lauree Chandler, MD  or Richardson Dopp, PA-C         Other Instructions

## 2022-05-20 NOTE — Telephone Encounter (Signed)
Pt was just calling to inform us that the [place] location has closed.

## 2022-05-20 NOTE — Assessment & Plan Note (Signed)
Etiology of his symptoms or not entirely clear.  He has been able to exert himself without chest symptoms.  Cardiac catheterization less than a year ago showed no CAD.  Electrocardiogram is normal.  I do not think he needs stress testing.  He did have an echocardiogram in November that demonstrated normal function of his AVR and stable measurement of aortic repair graft.  He has had a chronic cough since surgery.  Question if this is an ACE inhibitor induced cough.  Question if he could have vasospasm.  If that is the case, we will need to put him on a medication like amlodipine (which he has taken in the past) or isosorbide.  Question if acid reflux may be playing a role as well. Prescription for as needed nitroglycerin Protonix 40 mg daily x 2 weeks, then as needed DC lisinopril Start losartan Chest x-ray Follow-up 3 to 4 weeks

## 2022-05-20 NOTE — Progress Notes (Signed)
Cardiology Office Note:    Date:  05/20/2022  ID:  Johnny Henderson, DOB 12-26-1970, MRN [id number] [facility] Providers Cardiologist:  Lauree Chandler, MD      Patient Profile:   Bicuspid AV, aortic stenosis; dilated ascending aorta  S/p Bentall with bioprosthetic AVR and aortic root replacement in 08/2021 (Dr. Cyndia Bent) Limited TTE 01/08/2022: EF 55-60, normal structure and function of AVR (mean gradient 7), stable measurement of aortic repair graft (30 mm), mild LVH, normal RVSF, normal PASP (RVSP 19.2), trivial MR, RAP 3 Hypertension  LHC 07/26/21: no CAD    History of Present Illness:   Johnny Henderson is a 52 y.o. male who presents for the evaluation of chest pain. He called the office last week and was asked to go to the emergency room for evaluation.  He decided not to go.  He is here today with his wife.  He had an episode of chest pain last week.  He had 5-6 pains in a row.  He has not had any discomfort since.  This was not related to exertion.  He lifts weights on a regular basis without chest discomfort.  He operates a Forensic scientist at work and does not have chest discomfort with this.  He has a chronic cough since surgery.  Lisinopril was started around the time of his surgery.  He does cough up phlegm at times and it has also caused him to vomit.  He just saw ENT.  There are no notes available in care everywhere.  However, the patient notes that no abnormalities were noted.  He has not noticed any significant symptoms with eating.  He has not had pleuritic chest pain or chest pain with lying supine.  He has not had syncope or near syncope.  He has not noticed palpitations.  Review of Systems  Constitutional: Negative for chills and fever.  Gastrointestinal:  Negative for hematochezia and melena.       Studies Reviewed:    EKG: NSR, HR 64, normal axis, no ST-T wave changes, QTc 412  Risk Assessment/Calculations:     HYPERTENSION CONTROL Vitals:   05/20/22 1341 05/20/22 1653   BP: (!) 140/96 (!) 136/90    The patient's blood pressure is elevated above target today.  In order to address the patient's elevated BP: A new medication was prescribed today.          Physical Exam:   VS:  BP (!) 136/90   Pulse 64   Ht 6' (1.829 m)   Wt 256 lb 12.8 oz (116.5 kg)   SpO2 92%   BMI 34.83 kg/m    Wt Readings from Last 3 Encounters:  05/20/22 256 lb 12.8 oz (116.5 kg)  12/27/21 257 lb 9.6 oz (116.8 kg)  12/18/21 259 lb (117.5 kg)    Constitutional:      Appearance: Healthy appearance. Not in distress.  Neck:     Vascular: JVD normal.  Pulmonary:     Breath sounds: Normal breath sounds. No wheezing. No rales.  Cardiovascular:     Normal rate. Regular rhythm. Normal S1. Normal S2.      Murmurs: There is a grade 1/6 systolic murmur at the URSB.  Edema:    Peripheral edema absent.  Abdominal:     Palpations: Abdomen is soft.       ASSESSMENT AND PLAN:   Precordial chest pain Etiology of his symptoms or not entirely clear.  He has been able to exert himself without chest symptoms.  Cardiac catheterization less than a year ago showed no CAD.  Electrocardiogram is normal.  I do not think he needs stress testing.  He did have an echocardiogram in November that demonstrated normal function of his AVR and stable measurement of aortic repair graft.  He has had a chronic cough since surgery.  Question if this is an ACE inhibitor induced cough.  Question if he could have vasospasm.  If that is the case, we will need to put him on a medication like amlodipine (which he has taken in the past) or isosorbide.  Question if acid reflux may be playing a role as well. Prescription for as needed nitroglycerin Protonix 40 mg daily x 2 weeks, then as needed DC lisinopril Start losartan Chest x-ray Follow-up 3 to 4 weeks  AS (aortic stenosis) Status post Bentall procedure with bioprosthetic AVR and aortic root replacement 08/2021. Echocardiogram in Nov 2023 with stable AVR and  stable aortic graft size. Obtain CXR as noted. Continue SBE prophylaxis. He will have f/u with Dr. Cyndia Bent in 10/2022.   HTN (hypertension) Blood pressure above target.  Question if he has an ACE inhibitor induced cough.  DC lisinopril.  Start losartan 50 mg daily.  Follow-up in 3 to 4 weeks.  Obtain BMET at that time.       Dispo:  Return in about 4 weeks (around 06/17/2022) for Close Follow Up w/ Dr. Angelena Form, or Richardson Dopp, PA-C.  Signed, Richardson Dopp, PA-C

## 2022-05-20 NOTE — Assessment & Plan Note (Signed)
Status post Bentall procedure with bioprosthetic AVR and aortic root replacement 08/2021. Echocardiogram in Nov 2023 with stable AVR and stable aortic graft size. Obtain CXR as noted. Continue SBE prophylaxis. He will have f/u with Dr. Cyndia Bent in 10/2022.

## 2022-05-20 NOTE — Telephone Encounter (Signed)
Patient is calling to request speaking to RN, Anderson Malta, in regards to xrays he was to have done. Transferred to [place], [place].

## 2022-05-21 ENCOUNTER — Ambulatory Visit: Payer: Managed Care, Other (non HMO) | Admitting: Physician Assistant

## 2022-05-26 ENCOUNTER — Telehealth: Payer: Self-pay | Admitting: Cardiovascular Disease

## 2022-05-26 MED ORDER — AMLODIPINE BESYLATE 5 MG PO TABS: 5.0000 mg | ORAL_TABLET | Freq: Every day | ORAL | 3 refills | Status: DC

## 2022-05-26 NOTE — Telephone Encounter (Signed)
Patient seen by Richardson Dopp last week:  Will route to Dr. Angelena Form for recommendations since Nicki Reaper is not in office today.       ASSESSMENT AND PLAN:   Precordial chest pain Etiology of his symptoms or not entirely clear.  He has been able to exert himself without chest symptoms.  Cardiac catheterization less than a year ago showed no CAD.  Electrocardiogram is normal.  I do not think he needs stress testing.  He did have an echocardiogram in November that demonstrated normal function of his AVR and stable measurement of aortic repair graft.  He has had a chronic cough since surgery.  Question if this is an ACE inhibitor induced cough.  Question if he could have vasospasm.  If that is the case, we will need to put him on a medication like amlodipine (which he has taken in the past) or isosorbide.  Question if acid reflux may be playing a role as well. Prescription for as needed nitroglycerin Protonix 40 mg daily x 2 weeks, then as needed DC lisinopril Start losartan Chest x-ray Follow-up 3 to 4 weeks      HTN (hypertension) Blood pressure above target.  Question if he has an ACE inhibitor induced cough.  DC lisinopril.  Start losartan 50 mg daily.  Follow-up in 3 to 4 weeks.  Obtain BMET at that time.

## 2022-05-26 NOTE — Telephone Encounter (Signed)
Pt c/o medication issue:  1. Name of Medication: losartan (COZAAR) 50 MG tablet   2. How are you currently taking this medication (dosage and times per day)? Only took this once on 03/15  3. Are you having a reaction (difficulty breathing--STAT)? No   4. What is your medication issue? Patient states that this medication made his heart race and he did not feel well at all and would like a call back to get something else called in.

## 2022-05-26 NOTE — Telephone Encounter (Signed)
We can stop Losartan. We can start Norvasc 5 mg daily. Follow BP for the next two weeks and let us know if his blood pressure is still running high or if his heart is still racing. I doubt that the Losartan was making his heart race but we can stop it. Chris    Left detailed message (DPR) on cell phone to stop losartan and start amlodipine 5 mg daily.  Informed of all above information from Dr. Angelena Form and to call if BP >140/90 or if he more episodes of his heart racing.

## 2022-06-05 ENCOUNTER — Telehealth: Payer: Self-pay | Admitting: Cardiovascular Disease

## 2022-06-05 MED ORDER — LISINOPRIL 10 MG PO TABS: 10.0000 mg | ORAL_TABLET | Freq: Every day | ORAL | 3 refills | Status: DC

## 2022-06-05 NOTE — Telephone Encounter (Signed)
Pt c/o medication issue:  1. Name of Medication: amLODipine (NORVASC) 5 MG tablet   2. How are you currently taking this medication (dosage and times per day)?    Take 1 tablet (5 mg total) by mouth daily.    3. Are you having a reaction (difficulty breathing--STAT)? no  4. What is your medication issue? Patient states that he doesn't want to take the medication. The way it makes him feel he doesn't like. Please advise

## 2022-06-05 NOTE — Telephone Encounter (Signed)
146/96 this morning.  He hasn't picked up amlodipine.  He has friends who take it and deal w swelling and he read about side effects and said he is not wishing to try that one.  He has taken nothing for blood pressure the last several days.     Took losartan for 3 days and had that episode of his heart racing and even though he was told that should not be the cause he feels the med was the cause so he wishes to not take anymore either.   Is interested in trying lisinopril again because he did not think the cough was related to it.  He has added lansoprazole and it seems to be better.    Would be willing to go back to lisinopril 10 mg and see if tolerates.  Did not realize he had this upcoming appointment w Nicki Reaper.  06/17/22 10:55 am.    Will re eval bps and possibly check labs at that time.  Asked him to call back if develops cough again.    Will route to Dr. Angelena Form for authorization/any other recommendations and will call him back if needed.  Pt in agreement w this plan.

## 2022-06-06 NOTE — Telephone Encounter (Signed)
Left message for patient to call back  

## 2022-06-09 NOTE — Telephone Encounter (Signed)
Left message to call the clinic. 

## 2022-06-10 NOTE — Telephone Encounter (Signed)
Discussed w patient on 3/28 to restart lisinopril 10 mg daily.  No need to reach back out to him since Dr. Angelena Form is in agreement with plan to switch back to lisinopril.

## 2022-06-12 ENCOUNTER — Other Ambulatory Visit: Payer: Self-pay | Admitting: Physician Assistant

## 2022-06-17 ENCOUNTER — Ambulatory Visit: Payer: Managed Care, Other (non HMO) | Admitting: Physician Assistant

## 2022-07-22 ENCOUNTER — Other Ambulatory Visit: Payer: Self-pay | Admitting: Surgery

## 2022-07-22 DIAGNOSIS — Z8679 Personal history of other diseases of the circulatory system: Secondary | ICD-10-CM

## 2022-09-03 ENCOUNTER — Other Ambulatory Visit: Payer: Managed Care, Other (non HMO)

## 2022-09-03 ENCOUNTER — Ambulatory Visit: Payer: Managed Care, Other (non HMO) | Admitting: Surgery

## 2022-09-08 ENCOUNTER — Encounter: Payer: Self-pay | Admitting: Thoracic Surgery (Cardiothoracic Vascular Surgery)

## 2022-09-10 ENCOUNTER — Other Ambulatory Visit: Payer: Self-pay | Admitting: Surgery

## 2022-09-10 ENCOUNTER — Ambulatory Visit
Admission: RE | Admit: 2022-09-10 | Discharge: 2022-09-10 | Disposition: A | Discharge status: Home / Self Care | Payer: Managed Care, Other (non HMO) | Source: Ambulatory Visit | Attending: Surgery | Admitting: Surgery

## 2022-09-10 ENCOUNTER — Encounter: Payer: Self-pay | Admitting: Surgery

## 2022-09-10 ENCOUNTER — Ambulatory Visit: Payer: Managed Care, Other (non HMO) | Admitting: Surgery

## 2022-09-10 VITALS — BP 157/88 | HR 74 | Resp 20 | Ht 72.0 in | Wt 255.0 lb

## 2022-09-10 DIAGNOSIS — Z8679 Personal history of other diseases of the circulatory system: Secondary | ICD-10-CM

## 2022-09-10 DIAGNOSIS — I7101 Dissection of ascending aorta: Secondary | ICD-10-CM

## 2022-09-10 MED ORDER — IOPAMIDOL (ISOVUE-370) INJECTION 76%
75.0000 mL | Freq: Once | INTRAVENOUS | Status: AC | PRN
  Administered 2022-09-10: 75 mL via INTRAVENOUS

## 2022-09-14 NOTE — Progress Notes (Signed)
HPI:  The patient is a 52 year old gentleman who underwent replacement of his ascending aorta under deep hypothermic circulatory arrest and Bentall procedure using a 27 mm Edwards KONECT Resilia pericardial valved graft on 08/09/2021 for a 5.5 cm ascending aortic aneurysm and bicuspid aortic valve stenosis.  Since I saw him in August 2023 for follow-up he has continued to do well.  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  He denies any chest pain or shortness of breath.  Current Outpatient Medications  Medication Sig Dispense Refill   aspirin EC 81 MG tablet Take 1 tablet (81 mg total) by mouth daily. Swallow whole. 30 tablet 12   pantoprazole (PROTONIX) 40 MG tablet TAKE 1 TABLET BY MOUTH EVERY DAY 90 tablet 1   Testosterone 12.5 MG/ACT (1%) GEL 4 pump Topical Daily     lisinopril (ZESTRIL) 10 MG tablet Take 1 tablet (10 mg total) by mouth daily. (Patient not taking: Reported on 09/10/2022) 90 tablet 3   No current facility-administered medications for this visit.     Physical Exam: BP (!) 157/88   Pulse 74   Resp 20   Ht 6' (1.829 m)   Wt 255 lb (115.7 kg)   SpO2 96% Comment: RA  BMI 34.58 kg/m  He looks well. Cardiac exam shows a regular rate and rhythm with normal heart sounds.  There is no murmur. Lungs are clear. There is no peripheral edema.  Diagnostic Tests:  Narrative & Impression CLINICAL DATA:  Aneurysm of the ascending aorta.   EXAM: CT ANGIOGRAPHY CHEST WITH CONTRAST   TECHNIQUE: Multidetector CT imaging of the chest was performed using the standard protocol during bolus administration of intravenous contrast. Multiplanar CT image reconstructions and MIPs were obtained to evaluate the vascular anatomy.   RADIATION DOSE REDUCTION: This exam was performed according to the departmental dose-optimization program which includes automated exposure control, adjustment of the mA and/or kV according to patient size and/or use of iterative  reconstruction technique.   CONTRAST:  75mL ISOVUE-370 IOPAMIDOL (ISOVUE-370) INJECTION 76%   COMPARISON:  CT dated 07/11/2021.   FINDINGS: Evaluation of this exam is limited due to respiratory motion artifact.   Cardiovascular: There is no cardiomegaly or pericardial effusion. Status post prior aortic valve repair as well as postsurgical changes of the ascending aorta. No aneurysmal dilatation or dissection. The origins of the great vessels of the aortic arch appear patent. Evaluation of the pulmonary arteries is limited due to respiratory motion. No pulmonary artery embolus identified.   Mediastinum/Nodes: No hilar or mediastinal adenopathy. The esophagus is grossly unremarkable. Ill-defined 3 cm left thyroid nodule. Recommend thyroid US (ref: J Am Coll Radiol. 2015 Feb;12(2): 143-50).No mediastinal fluid collection.   Lungs/Pleura: No focal consolidation, pleural effusion, or pneumothorax. The central airways are patent.   Upper Abdomen: No acute abnormality.   Musculoskeletal: Degenerative changes spine. Median sternotomy wires. No acute osseous pathology.   Review of the MIP images confirms the above findings.   IMPRESSION: 1. Status post prior aortic valve and ascending aorta repair. No aneurysmal dilatation or dissection. 2. No acute intrathoracic pathology. 3. A 3 cm left thyroid nodule. Recommend thyroid US.     Electronically Signed   By: Elgie Collard M.D.   On: 09/10/2022 15:58    Impression:  He is about 1 year out from his surgery.  A follow-up CTA of the chest shows a stable aortic repair without evidence of new aneurysmal disease.  It did show a 3 cm  left thyroid nodule and thyroid ultrasound was recommended which we will schedule.  I reviewed the CTA images with him and answered his questions.  I stressed the importance of continued good blood pressure control in preventing further aortic enlargement and acute aortic dissection.  Plan:  He will  continue to follow-up with cardiology.  We will arrange a thyroid ultrasound to evaluate the 3 cm left thyroid nodule.  I spent 20 minutes performing this established patient evaluation and > 50% of this time was spent face to face counseling and coordinating the surveillance of his previously resected aortic aneurysm.   [name], MD [facility] [phone]

## 2022-09-15 ENCOUNTER — Other Ambulatory Visit: Payer: Self-pay | Admitting: Surgery

## 2022-09-15 DIAGNOSIS — E041 Nontoxic single thyroid nodule: Secondary | ICD-10-CM

## 2022-09-17 ENCOUNTER — Ambulatory Visit: Payer: Managed Care, Other (non HMO) | Admitting: Surgery

## 2022-09-26 ENCOUNTER — Ambulatory Visit
Admission: RE | Admit: 2022-09-26 | Discharge: 2022-09-26 | Disposition: A | Discharge status: Home / Self Care | Payer: Managed Care, Other (non HMO) | Source: Ambulatory Visit | Attending: Surgery | Admitting: Surgery

## 2022-09-26 DIAGNOSIS — E041 Nontoxic single thyroid nodule: Secondary | ICD-10-CM

## 2022-10-01 ENCOUNTER — Other Ambulatory Visit: Payer: Self-pay | Admitting: Surgery

## 2022-10-01 DIAGNOSIS — E041 Nontoxic single thyroid nodule: Secondary | ICD-10-CM

## 2022-12-05 ENCOUNTER — Other Ambulatory Visit (HOSPITAL_COMMUNITY)
Admission: RE | Admit: 2022-12-05 | Discharge: 2022-12-05 | Disposition: A | Discharge status: Home / Self Care | Payer: Managed Care, Other (non HMO) | Source: Ambulatory Visit | Attending: Interventional Radiology | Admitting: Interventional Radiology

## 2022-12-05 ENCOUNTER — Ambulatory Visit
Admission: RE | Admit: 2022-12-05 | Discharge: 2022-12-05 | Disposition: A | Discharge status: Home / Self Care | Payer: Managed Care, Other (non HMO) | Source: Ambulatory Visit | Attending: Surgery | Admitting: Surgery

## 2022-12-05 DIAGNOSIS — E041 Nontoxic single thyroid nodule: Secondary | ICD-10-CM | POA: Insufficient documentation

## 2022-12-08 LAB — CYTOLOGY - NON PAP

## 2022-12-22 ENCOUNTER — Encounter (HOSPITAL_COMMUNITY): Payer: Self-pay

## 2023-01-27 ENCOUNTER — Ambulatory Visit: Payer: Self-pay | Admitting: Surgery

## 2023-01-28 ENCOUNTER — Telehealth (HOSPITAL_BASED_OUTPATIENT_CLINIC_OR_DEPARTMENT_OTHER): Payer: Self-pay | Admitting: *Deleted

## 2023-01-28 NOTE — Telephone Encounter (Signed)
   Pre-operative Risk Assessment    Patient Name: Johnny Henderson  DOB: [birth year] MRN: [id number]   Last OV: Brynda Rim 05/20/2022  Request for Surgical Clearance    Procedure:   Thyroid Surgery  Date of Surgery:  Clearance TBD                                 Surgeon:  Dr. Darnell Level Surgeon's Group or Practice Name:  [facility] Phone number:  [phone] Fax number:  [phone]   Type of Clearance Requested:   - Medical  - Pharmacy:  Hold Aspirin Not Indicated   Type of Anesthesia:  General    Additional requests/questions:    Signed, Emmit Pomfret   01/28/2023, 8:14 AM

## 2023-01-28 NOTE — Telephone Encounter (Signed)
1st attempt to reach pt to schedule IN OFFICE visit. No answer

## 2023-01-28 NOTE — Telephone Encounter (Signed)
   Name: Johnny Henderson  DOB: 23-Feb-1971  MRN: [id number]  Primary Cardiologist: Verne Carrow, MD Last OV 05/21/22, patient overdue for follow up, was to follow up 3-4 weeks after appointment   Chart reviewed as part of pre-operative protocol coverage. Because of [name] past medical history and time since last visit, he will require a follow-up in-office visit in order to better assess preoperative cardiovascular risk.  Pre-op covering staff: - Please schedule appointment and call patient to inform them. If patient already had an upcoming appointment within acceptable timeframe, please add "pre-op clearance" to the appointment notes so provider is aware. - Please contact requesting surgeon's office via preferred method (i.e, phone, fax) to inform them of need for appointment prior to surgery.  Rip Harbour, NP  01/28/2023, 5:08 PM

## 2023-01-30 NOTE — Telephone Encounter (Signed)
Patient scheduled to see Dr. Clifton James on 03/15/22. Offered patient earlier appointment with APP but patient only wants to see Dr.McAlhany.

## 2023-03-16 ENCOUNTER — Ambulatory Visit: Payer: Managed Care, Other (non HMO) | Attending: Cardiovascular Disease | Admitting: Cardiovascular Disease

## 2023-03-16 ENCOUNTER — Encounter: Payer: Self-pay | Admitting: Cardiovascular Disease

## 2023-03-16 VITALS — BP 134/86 | HR 67 | Ht 72.0 in | Wt 278.0 lb

## 2023-03-16 DIAGNOSIS — I10 Essential (primary) hypertension: Secondary | ICD-10-CM | POA: Diagnosis not present

## 2023-03-16 DIAGNOSIS — I350 Nonrheumatic aortic (valve) stenosis: Secondary | ICD-10-CM | POA: Diagnosis not present

## 2023-03-16 DIAGNOSIS — I7121 Aneurysm of the ascending aorta, without rupture: Secondary | ICD-10-CM | POA: Diagnosis not present

## 2023-03-16 DIAGNOSIS — Z01818 Encounter for other preprocedural examination: Secondary | ICD-10-CM

## 2023-03-16 DIAGNOSIS — Z01810 Encounter for preprocedural cardiovascular examination: Secondary | ICD-10-CM

## 2023-03-16 MED ORDER — METOPROLOL SUCCINATE ER 25 MG PO TB24: 25.0000 mg | ORAL_TABLET | Freq: Every day | ORAL | 3 refills | Status: AC

## 2023-03-16 NOTE — Progress Notes (Signed)
 Chief Complaint  Patient presents with   Follow-up    Aortic valve disease   History of Present Illness: 53 yo male with history of HTN, dilated ascending aorta and bicuspid aortic valve with aortic stenosis now s/p aortic root replacement and AVR who is here today for cardiac follow up. I saw him as a new patient for the evaluation of aortic stenosis in March 2023. Echo January 2023 at [facility] center with LVEF=55-60%. The aortic valve is noted to be bicuspid. Moderate aortic stenosis with mean gradient 28 mmHg. Dilated ascending aorta. He was feeling well.  Chest CTA with dilated ascending aorta at 5.2 cm. He was seen initially by Dr. Shyrl in the CT surgery office. Surgery was discussed. He was then seen in the ED with chest pain on 07/11/21 and repeat chest CTA May 2023 with dilated ascending aorta at 5.4 cm. Cardiac cath 07/26/21 with no angiographic evidence of CAD. He was seen by Dr. Lucas in the CT surgery office and underwent surgical AVR and aortic root replacement 08/09/21.  Echo November 2023 with LVEF=55-60%. Normally functioning AVR.   He is here today for follow up. The patient denies any chest pain, dyspnea, palpitations, lower extremity edema, orthopnea, PND, dizziness, near syncope or syncope. He has an upcoming thyroid  surgery and will need risk assessment. 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 He is planning to have a partial thyroidectomy.   Primary Care Physician: Duwayne Katheryn HERO, PA  Past Medical History:  Diagnosis Date   Aortic stenosis    diagnosed at age 73   Heart murmur    History of chicken pox    Hypertension    Hypogonadism male    Low serum testosterone    Non-recurrent bilateral inguinal hernia    PONV (postoperative nausea and vomiting)    Past Surgical History:  Procedure Laterality Date   BENTALL PROCEDURE N/A 08/09/2021   Procedure: BENTALL PROCEDURE USING Konect RESILIA  AORTIC  VALVED CONDUIT;  Surgeon: Lucas Dorise POUR, MD;  Location: [facility] OR;  Service: Open Heart Surgery;  Laterality: N/A;  CIRC ARREST   KNEE SURGERY Right    age 89s. x2   LEFT HEART CATH AND CORONARY ANGIOGRAPHY N/A 07/26/2021   Procedure: LEFT HEART CATH AND CORONARY ANGIOGRAPHY;  Surgeon: Verlin Lonni BIRCH, MD;  Location: [facility] INVASIVE CV LAB;  Service: Cardiovascular;  Laterality: N/A;   TEE WITHOUT CARDIOVERSION N/A 08/09/2021   Procedure: TRANSESOPHAGEAL ECHOCARDIOGRAM (TEE);  Surgeon: Lucas Dorise POUR, MD;  Location: [facility] OR;  Service: Open Heart Surgery;  Laterality: N/A;   THORACIC AORTIC ANEURYSM REPAIR N/A 08/09/2021   Procedure: REPLACEMENT OF ASCENDING AORTIC ANEURYSM USING HEMASHIELD PLATINUM VASCULAR GRAFT;  Surgeon: Lucas Dorise POUR, MD;  Location: [facility] OR;  Service: Open Heart Surgery;  Laterality: N/A;    Current Outpatient Medications  Medication Sig Dispense Refill   metoprolol  succinate (TOPROL  XL) 25 MG 24 hr tablet Take 1 tablet (25 mg total) by mouth daily. 90 tablet 3   testosterone cypionate (DEPOTESTOSTERONE CYPIONATE) 200 MG/ML injection Inject 200 mg into the muscle every 14 (fourteen) days.     aspirin  EC 81 MG tablet Take 1 tablet (81 mg total) by mouth daily. Swallow whole. 30 tablet 12   No current facility-administered medications for this visit.    Allergies  Allergen Reactions   Bee Venom Anaphylaxis   Other Anaphylaxis    Wasps  Yellow Jackets    Motrin [Ibuprofen] Nausea And Vomiting and Other (See Comments)    GI Upset Ok if eat something before    Social History   Socioeconomic History   Marital status: Married    Spouse name: Not on file   Number of children: 3   Years of education: Not on file   Highest education level: Not on file  Occupational History   Occupation: Psychologist, [employer]  Tobacco Use   Smoking status: Never   Smokeless tobacco: Never  Vaping Use   Vaping status: Never Used  Substance and Sexual Activity   Alcohol use: No    Drug use: No   Sexual activity: Not on file  Other Topics Concern   Not on file  Social History Narrative   Some college, chiropodist   Married    3 kids   Social Drivers of Health   Financial Resource Strain: Not on file  Food Insecurity: Not on file  Transportation Needs: Not on file  Physical Activity: Not on file  Stress: Not on file  Social Connections: Not on file  Intimate Partner Violence: Not on file    Family History  Problem Relation Age of Onset   Arthritis Other    Cancer Other        Breast   Hyperlipidemia Other    Heart disease Other     Review of Systems:  As stated in the HPI and otherwise negative.   BP 134/86   Pulse 67   Ht 6' (1.829 m)   Wt 126.1 kg   SpO2 95%   BMI 37.70 kg/m   Physical Examination: General: Well developed, well nourished, NAD  HEENT: OP clear, mucus membranes moist  SKIN: warm, dry. No rashes. Neuro: No focal deficits  Musculoskeletal: Muscle strength 5/5 all ext  Psychiatric: Mood and affect normal  Neck: No JVD, no carotid bruits, no thyromegaly, no lymphadenopathy.  Lungs:Clear bilaterally, no wheezes, rhonci, crackles Cardiovascular: Regular rate and rhythm. No murmurs, gallops or rubs. Abdomen:Soft. Bowel sounds present. Non-tender.  Extremities: No lower extremity edema. Pulses are 2 + in the bilateral DP/PT.  EKG:  EKG is ordered today. The ekg ordered today demonstrates EKG Interpretation Date/Time:  Monday March 16 2023 10:40:09 EST Ventricular Rate:  64 PR Interval:  182 QRS Duration:  94 QT Interval:  412 QTC Calculation: 425 R Axis:   -13  Text Interpretation: Normal sinus rhythm Normal ECG Confirmed by Verlin Bruckner [phone]) on 03/16/2023 10:49:18 AM    Recent Labs: No results found for requested labs within last 365 days.   Lipid Panel No results found for: CHOL, TRIG, HDL, CHOLHDL, VLDL, LDLCALC, LDLDIRECT   Wt Readings from Last 3 Encounters:  03/16/23 126.1 kg   09/10/22 115.7 kg  05/20/22 116.5 kg     Assessment and Plan:   1. Bicuspid aortic valve stenosis/Thoracic aortic aneurysm: He is s/p AVR and aortic root replacement June 2023. He is doing well. Will continue ASA and Lisinopril .   2. HTN: BP is in a normal range today off of Lisinopril  which he stopped due to a cough. He tried Losartan  in the past and did not like it and also does not wish to take Norvasc  due to hearing about his friends having LE edema. Will start Toprol  25 mg daily.   3. Pre-operative cardiovascular risk assessment: He is very active. He has no chest pain or dyspnea with exercise. He can proceed with his planned thyroid  procedure and  can hold ASA one week before his procedure if indicated.   Labs/ tests ordered today include:   Orders Placed This Encounter  Procedures   EKG 12-Lead   Disposition:   F/U with in one year.   Signed, Lonni Cash, MD 03/16/2023 12:07 PM    [facility] [address] [place], [place], [place]  [address] Phone: [phone]; Fax: [phone]

## 2023-03-16 NOTE — Patient Instructions (Signed)
 Medication Instructions:  Your physician has recommended you make the following change in your medication:  1.) start metoprolol  succinate (Toprol  XL) 25 mg - take one tablet daily  *If you need a refill on your cardiac medications before your next appointment, please call your pharmacy*   Lab Work: none   Testing/Procedures: none   Follow-Up: At [facility], you and your health needs are our priority.  As part of our continuing mission to provide you with exceptional heart care, we have created designated Provider Care Teams.  These Care Teams include your primary Cardiologist (physician) and Advanced Practice Providers (APPs -  Physician Assistants and Nurse Practitioners) who all work together to provide you with the care you need, when you need it.   Your next appointment:   12 month(s)  Provider:   Lonni Cash, MD

## 2023-04-07 ENCOUNTER — Encounter (HOSPITAL_COMMUNITY): Payer: Managed Care, Other (non HMO)

## 2023-04-07 NOTE — Progress Notes (Signed)
COVID Vaccine received:  [x]  No []  Yes Date of any COVID positive Test in last 90 days: none  PCP - Daphane Shepherd, PA at [facility] [phone]  Fax: [phone]  Cardiologist - Earney Hamburg, MD  cardiac clearance in 03-16-23 Epic note Thoracic - Rexanne Mano, MD  Chest x-ray - 10-09-2021  2v  Epic EKG -  03-16-2023  Epic Stress Test -  ECHO - 01-08-2022  Epic Cardiac Cath - 07-26-2021  by Dr. Clifton James  PCR screen: []  Ordered & Completed []   No Order but Needs PROFEND     []   N/A for this surgery  Surgery Plan:  []  Ambulatory   [x]  Outpatient in bed  []  Admit Anesthesia:    [x]  General  []  Spinal  []   Choice []   MAC  Pacemaker / ICD device [x]  No []  Yes   Spinal Cord Stimulator:[x]  No []  Yes       History of Sleep Apnea? [x]  No []  Yes   CPAP used?- [x]  No []  Yes    Does the patient monitor blood sugar?   [x]  N/A   []  No []  Yes  Patient has: []  NO Hx DM   [x]  Pre-DM   []  DM1  []   DM2 Last A1c was: 5.8 on    10-24-2022   Blood Thinner / Instructions: None Aspirin Instructions: ASA 81 mg   Hold 5-7 days  ERAS Protocol Ordered: []  No  [x]  Yes PRE-SURGERY []  ENSURE  []  G2   [x]  No Drink Ordered Patient is to be NPO after: 0430  Dental hx: []  Dentures:  [x]  N/A      []  Bridge or Partial:                   []  Loose or Damaged teeth:   Comments:   Activity level: Patient is able  to climb a flight of stairs without difficulty; [x]  No CP  [x]  No SOB  Patient can  perform ADLs without assistance.   Anesthesia review: HTN, s/p Bentall procedure, AVR- 08-09-2021,   PONV  Patient denies shortness of breath, fever, cough and chest pain at PAT appointment.  Patient verbalized understanding and agreement to the Pre-Surgical Instructions that were given to them at this PAT appointment. Patient was also educated of the need to review these PAT instructions again prior to his/her surgery.I reviewed the appropriate phone numbers to call if they have any and questions or concerns.

## 2023-04-07 NOTE — Patient Instructions (Signed)
SURGICAL WAITING ROOM VISITATION Patients having surgery or a procedure may have no more than 2 support people in the waiting area - these visitors may rotate in the visitor waiting room.   Due to an increase in RSV and influenza rates and associated hospitalizations, children ages 37 and under may not visit patients in [facility] hospitals. If the patient needs to stay at the hospital during part of their recovery, the visitor guidelines for inpatient rooms apply.  PRE-OP VISITATION  Pre-op nurse will coordinate an appropriate time for 1 support person to accompany the patient in pre-op.  This support person may not rotate.  This visitor will be contacted when the time is appropriate for the visitor to come back in the pre-op area.  Please refer to the [facility] website for the visitor guidelines for Inpatients (after your surgery is over and you are in a regular room).  You are not required to quarantine at this time prior to your surgery. However, you must do this: Hand Hygiene often Do NOT share personal items Notify your provider if you are in close contact with someone who has COVID or you develop fever 100.4 or greater, new onset of sneezing, cough, sore throat, shortness of breath or body aches.  If you test positive for Covid or have been in contact with anyone that has tested positive in the last 10 days please notify you surgeon.    Your procedure is scheduled on:  Monday  April 20, 2023  Report to [facility] Main Entrance: [facility] entrance where the [employer] is available.   Report to admitting at: 05:15 AM  Call this number if you have any questions or problems the morning of surgery [phone]  Do not eat food after Midnight the night prior to your surgery/procedure.  After Midnight you may have the following liquids until  04:30 AM DAY OF SURGERY  Clear Liquid Diet Water Black Coffee (sugar ok, NO MILK/CREAM OR CREAMERS)  Tea (sugar ok, NO  MILK/CREAM OR CREAMERS) regular and decaf                             Plain Jell-O  with no fruit (NO RED)                                           Fruit ices (not with fruit pulp, NO RED)                                     Popsicles (NO RED)                                                                  Juice: NO CITRUS JUICES: only apple, WHITE grape, WHITE cranberry Sports drinks like Gatorade or Powerade (NO RED)               FOLLOW ANY ADDITIONAL PRE OP INSTRUCTIONS YOU RECEIVED FROM YOUR SURGEON'S OFFICE!!!   Oral Hygiene is also important to reduce your risk of infection.  Remember - BRUSH YOUR TEETH THE MORNING OF SURGERY WITH YOUR REGULAR TOOTHPASTE  Do NOT smoke after Midnight the night before surgery.  STOP TAKING all Vitamins, Herbs and supplements 1 week before your surgery.   Take ONLY these medicines the morning of surgery with A SIP OF WATER: metoprolol                  You may not have any metal on your body including  jewelry, and body piercing  Do not wear  lotions, powders,  cologne, or deodorant  Men may shave face and neck.  Contacts, Hearing Aids, dentures or bridgework may not be worn into surgery. DENTURES WILL BE REMOVED PRIOR TO SURGERY PLEASE DO NOT APPLY "Poly grip" OR ADHESIVES!!!  You may bring a small overnight bag with you on the day of surgery, only pack items that are not valuable. [facility] IS NOT RESPONSIBLE   FOR VALUABLES THAT ARE LOST OR STOLEN.   Do not bring your home medications to the hospital. The Pharmacy will dispense medications listed on your medication list to you during your admission in the Hospital.  Special Instructions: Bring a copy of your healthcare power of attorney and living will documents the day of surgery, if you wish to have them scanned into your [facility] Records- EPIC  Please read over the following fact sheets you were given: IF YOU HAVE QUESTIONS ABOUT YOUR PRE-OP INSTRUCTIONS, PLEASE CALL  [phone]   [facility] - Preparing for Surgery Before surgery, you can play an important role.  Because skin is not sterile, your skin needs to be as free of germs as possible.  You can reduce the number of germs on your skin by washing with CHG (chlorahexidine gluconate) soap before surgery.  CHG is an antiseptic cleaner which kills germs and bonds with the skin to continue killing germs even after washing. Please DO NOT use if you have an allergy to CHG or antibacterial soaps.  If your skin becomes reddened/irritated stop using the CHG and inform your nurse when you arrive at Short Stay. Do not shave (including legs and underarms) for at least 48 hours prior to the first CHG shower.  You may shave your face/neck.  Please follow these instructions carefully:  1.  Shower with CHG Soap the night before surgery and the  morning of surgery.  2.  If you choose to wash your hair, wash your hair first as usual with your normal  shampoo.  3.  After you shampoo, rinse your hair and body thoroughly to remove the shampoo.                             4.  Use CHG as you would any other liquid soap.  You can apply chg directly to the skin and wash.  Gently with a scrungie or clean washcloth.  5.  Apply the CHG Soap to your body ONLY FROM THE NECK DOWN.   Do not use on face/ open                           Wound or open sores. Avoid contact with eyes, ears mouth and genitals (private parts).                       Wash face,  Genitals (private parts) with your normal soap.  6.  Wash thoroughly, paying special attention to the area where your  surgery  will be performed.  7.  Thoroughly rinse your body with warm water from the neck down.  8.  DO NOT shower/wash with your normal soap after using and rinsing off the CHG Soap.            9.  Pat yourself dry with a clean towel.            10.  Wear clean pajamas.            11.  Place clean sheets on your bed the night of your first shower and do not   sleep with pets.  ON THE DAY OF SURGERY : Do not apply any lotions/deodorants the morning of surgery.  Please wear clean clothes to the hospital/surgery center.     FAILURE TO FOLLOW THESE INSTRUCTIONS MAY RESULT IN THE CANCELLATION OF YOUR SURGERY  PATIENT SIGNATURE_________________________________  NURSE SIGNATURE__________________________________  ________________________________________________________________________

## 2023-04-08 ENCOUNTER — Encounter (HOSPITAL_COMMUNITY): Payer: Self-pay

## 2023-04-08 ENCOUNTER — Other Ambulatory Visit: Payer: Self-pay

## 2023-04-08 ENCOUNTER — Encounter (HOSPITAL_COMMUNITY)
Admission: RE | Admit: 2023-04-08 | Discharge: 2023-04-08 | Disposition: A | Discharge status: Home / Self Care | Payer: Managed Care, Other (non HMO) | Source: Ambulatory Visit | Attending: Surgery | Admitting: Surgery

## 2023-04-08 VITALS — BP 138/77 | HR 64 | Temp 98.9°F | Resp 16 | Ht 72.0 in | Wt 270.0 lb

## 2023-04-08 DIAGNOSIS — Z01812 Encounter for preprocedural laboratory examination: Secondary | ICD-10-CM | POA: Diagnosis present

## 2023-04-08 DIAGNOSIS — I350 Nonrheumatic aortic (valve) stenosis: Secondary | ICD-10-CM | POA: Insufficient documentation

## 2023-04-08 DIAGNOSIS — D497 Neoplasm of unspecified behavior of endocrine glands and other parts of nervous system: Secondary | ICD-10-CM | POA: Diagnosis not present

## 2023-04-08 DIAGNOSIS — I10 Essential (primary) hypertension: Secondary | ICD-10-CM | POA: Insufficient documentation

## 2023-04-08 DIAGNOSIS — Z01818 Encounter for other preprocedural examination: Secondary | ICD-10-CM

## 2023-04-08 HISTORY — DX: Pneumonia, unspecified organism: J18.9

## 2023-04-08 LAB — BASIC METABOLIC PANEL
Anion gap: 7 (ref 5–15)
BUN: 19 mg/dL (ref 6–20)
CO2: 25 mmol/L (ref 22–32)
Calcium: 8.8 mg/dL — ABNORMAL LOW (ref 8.9–10.3)
Chloride: 104 mmol/L (ref 98–111)
Creatinine, Ser: 1.05 mg/dL (ref 0.61–1.24)
GFR, Estimated: >60 mL/min (ref >60)
Glucose, Bld: 92 mg/dL (ref 70–99)
Potassium: 4.2 mmol/L (ref 3.5–5.1)
Sodium: 136 mmol/L (ref 135–145)

## 2023-04-08 LAB — CBC
HCT: 48.6 % (ref 39.0–52.0)
Hemoglobin: 15.9 g/dL (ref 13.0–17.0)
MCH: 32.7 pg (ref 26.0–34.0)
MCHC: 32.7 g/dL (ref 30.0–36.0)
MCV: 100 fL (ref 80.0–100.0)
Platelets: 228 10*3/uL (ref 150–400)
RBC: 4.86 MIL/uL (ref 4.22–5.81)
RDW: 13 % (ref 11.5–15.5)
WBC: 9.7 10*3/uL (ref 4.0–10.5)
nRBC: 0 % (ref 0.0–0.2)

## 2023-04-09 NOTE — Progress Notes (Signed)
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 never smoker with h/o PONV, HTN, aortic stenosis s/p AVR 08/09/2021, thyroid neoplasm scheduled for above procedure 04/20/2023 with Dr. Darnell Level.   Pt last seen by cardiology 03/16/2023. Per OV note, "Pre-operative cardiovascular risk assessment: He is very active. He has no chest pain or dyspnea with exercise. He can proceed with his planned thyroid procedure and can hold ASA one week before his procedure if indicated."  VS: BP 138/77 Comment: right arm sitting  Pulse 64   Temp 37.2 C (Oral)   Resp 16   Ht 6' (1.829 m)   Wt 122.5 kg   SpO2 96%   BMI 36.62 kg/m   PROVIDERS: Shellia Cleverly, PA is PCP   Cardiologist - Earney Hamburg, MD   LABS: Labs reviewed: Acceptable for surgery. (all labs ordered are listed, but only abnormal results are displayed)  Labs Reviewed  BASIC METABOLIC PANEL - Abnormal; Notable for the following components:      Result Value   Calcium 8.8 (*)    All other components within normal limits  CBC     IMAGES:   EKG:   CV: Echo 01/08/2022 1. The aortic valve has been repaired/replaced. Procedure Date: 08/09/2021.  Echo findings are consistent with normal structure and function of the  aortic valve prosthesis. Aortic valve area, by VTI measures 3.10 cm.  Aortic valve mean gradient measures  7.0 mmHg. Aortic valve Vmax measures 1.87 m/s.   2. 30 mm aortic repair graft. Stable measurements. Aortic root/ascending  aorta has been repaired/replaced.   3. Left ventricular ejection fraction, by estimation, is 55 to 60%. The  left ventricle has normal function. There is mild concentric left  ventricular hypertrophy. Left ventricular diastolic  parameters were  normal.   4. Right ventricular systolic function is normal. The right ventricular  size is mildly enlarged. There is normal pulmonary artery systolic  pressure. The estimated right ventricular systolic pressure is 19.2 mmHg.   5. The mitral valve is grossly normal. Trivial mitral valve  regurgitation. No evidence of mitral stenosis.   6. The inferior vena cava is normal in size with greater than 50%  respiratory variability, suggesting right atrial pressure of 3 mmHg.   Past Medical History:  Diagnosis Date   Aortic stenosis    diagnosed at age 53   Heart murmur    History of chicken pox    Hypertension    Hypogonadism male    Low serum testosterone    Non-recurrent bilateral inguinal hernia    Pneumonia    PONV (postoperative nausea and vomiting)     Past Surgical History:  Procedure Laterality Date   BENTALL PROCEDURE N/A 08/09/2021   Procedure: BENTALL PROCEDURE USING Konect RESILIA  AORTIC VALVED CONDUIT;  Surgeon: [name], MD;  Location: [facility] OR;  Service: Open Heart Surgery;  Laterality: N/A;  CIRC ARREST   KNEE SURGERY Right    age 30s. x2   LEFT HEART CATH AND CORONARY ANGIOGRAPHY N/A 07/26/2021   Procedure: LEFT HEART CATH AND CORONARY ANGIOGRAPHY;  Surgeon: Kathleene Hazel, MD;  Location: [facility] INVASIVE CV LAB;  Service: Cardiovascular;  Laterality: N/A;   TEE WITHOUT CARDIOVERSION  N/A 08/09/2021   Procedure: TRANSESOPHAGEAL ECHOCARDIOGRAM (TEE);  Surgeon: [name], MD;  Location: [facility] OR;  Service: Open Heart Surgery;  Laterality: N/A;   THORACIC AORTIC ANEURYSM REPAIR N/A 08/09/2021   Procedure: REPLACEMENT OF ASCENDING AORTIC ANEURYSM USING HEMASHIELD PLATINUM VASCULAR GRAFT;  Surgeon: [name], MD;  Location: [facility] OR;  Service: Open Heart Surgery;  Laterality: N/A;    MEDICATIONS:  aspirin EC 81 MG tablet   metoprolol succinate (TOPROL XL) 25 MG 24 hr tablet   testosterone cypionate (DEPOTESTOSTERONE CYPIONATE) 200 MG/ML  injection   No current facility-administered medications for this encounter.    Jodell Cipro Ward, PA-C [facility] Pre-Surgical Testing [phone]

## 2023-04-15 ENCOUNTER — Encounter (HOSPITAL_COMMUNITY): Payer: Self-pay | Admitting: Surgery

## 2023-04-15 DIAGNOSIS — E041 Nontoxic single thyroid nodule: Secondary | ICD-10-CM | POA: Diagnosis present

## 2023-04-15 DIAGNOSIS — D440 Neoplasm of uncertain behavior of thyroid gland: Secondary | ICD-10-CM | POA: Diagnosis present

## 2023-04-15 NOTE — H&P (Signed)
 REFERRING PHYSICIAN: Lucas Dorise POUR, MD  PROVIDER: Zacariah Belue OZELL SPINNER, MD   Chief Complaint: New Consultation (Thyroid  neoplasm of uncertain behavior)  History of Present Illness:  Patient is referred by Dr. Dorise Lucas for surgical evaluation and management of a newly diagnosed thyroid  neoplasm of uncertain behavior. Since primary care provider is Katheryn Freshwater, [place]. Patient had undergone cardiac surgical procedure in 2003. As part of his follow-up, he underwent a CT scan this past summer in July 2023. Incidental finding was made of a left thyroid  nodule. Patient underwent ultrasound examination on September 29, 2022. This showed a 3.4 x 2.6 x 2.2 cm nodule which was felt to be mildly suspicious and fine-needle aspiration biopsy was recommended. Patient underwent biopsy on December 05, 2022. 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 Specimen was sent for [facility] testing. Molecular genetic results returned with a result of suspicious, rendering a risk of malignancy of 50%. There were no DNA mutations. Patient is now referred for evaluation for surgical resection for definitive diagnosis and management. Patient has no prior history of thyroid  disease. He has never been on thyroid  medication. He has had no prior head or neck surgery. There is no family history of thyroid  disease. Patient works as a designer, television/film set at [employer]. He is accompanied today by his wife.  Review of Systems: A complete review of systems was obtained from the patient. I have reviewed this information and discussed as appropriate with the patient. See HPI as well for other ROS.  Review of Systems  Constitutional: Negative.  HENT: Negative.  Eyes: Negative.  Respiratory: Negative.  Cardiovascular: Negative.  Gastrointestinal: Negative.  Genitourinary: Negative.  Musculoskeletal: Negative.  Skin: Negative.  Neurological: Negative.  Endo/Heme/Allergies: Negative.   Psychiatric/Behavioral: Negative.    Medical History: History reviewed. No pertinent past medical history.  Patient Active Problem List  Diagnosis  Thyroid  nodule  Neoplasm of uncertain behavior of thyroid  gland   History reviewed. No pertinent surgical history.   Allergies  Allergen Reactions  Ibuprofen Vomiting, Nausea And Vomiting and Other (See Comments)  GI Upset  GI Upset Ok if eat something before   Current Outpatient Medications on File Prior to Visit  Medication Sig Dispense Refill  aspirin  81 MG EC tablet Take 81 mg by mouth once daily  lisinopriL  (ZESTRIL ) 10 MG tablet Take 1 tablet by mouth once daily  pantoprazole  (PROTONIX ) 40 MG DR tablet Take 1 tablet by mouth once daily  testosterone (VOGELXO) 12.5 mg/1.25 gram (1 %) gel in metered dose pump 4 pump Topical Daily   No current facility-administered medications on file prior to visit.   History reviewed. No pertinent family history.   Social History   Tobacco Use  Smoking Status Not on file  Smokeless Tobacco Not on file    Social History   Socioeconomic History  Marital status: Married   Objective:   Vitals:  BP: (!) 150/100  Pulse: 75  Temp: 36.8 C (98.2 F)  SpO2: 97%  Weight: (!) 124.6 kg (274 lb 12.8 oz)  Height: 182.9 cm (6')  PainSc: 0-No pain   Body mass index is 37.27 kg/m.  Physical Exam   GENERAL APPEARANCE Comfortable, no acute issues Development: normal Gross deformities: none  SKIN Rash, lesions, ulcers: none Induration, erythema: none Nodules: none palpable  EYES Conjunctiva and lids: normal Pupils: equal  EARS, NOSE, MOUTH, THROAT External ears: no lesion or deformity External nose: no lesion or deformity Hearing: grossly normal  NECK Symmetric: yes Trachea: midline Thyroid : Palpation of the right thyroid  lobe shows no significant nodularity. Palpation on the left demonstrates a smooth slightly firm nodule in the mid to lower left thyroid  lobe, mobile  with swallowing, and nontender. There is no associated lymphadenopathy.  CHEST/CV Not assessed  ABDOMEN Not assessed  GENITOURINARY/RECTAL Not assessed  MUSCULOSKELETAL Station and gait: normal Digits and nails: no clubbing or cyanosis Muscle strength: grossly normal all extremities Deformity: none  LYMPHATIC Cervical: none palpable Supraclavicular: none palpable  PSYCHIATRIC Oriented to person, place, and time: yes Mood and affect: normal for situation Judgment and insight: appropriate for situation   Assessment and Plan:   Thyroid  nodule Neoplasm of uncertain behavior of thyroid  gland  Patient is referred by his cardiac surgeon, Dr. Dorise Fellers, for surgical evaluation and management of a newly diagnosed thyroid  neoplasm of uncertain behavior.  Patient provided with a copy of The Thyroid  Book: Medical and Surgical Treatment of Thyroid  Problems, published by [employer], 16 pages. Book reviewed and explained to patient during visit today.  Today we reviewed his clinical history and discussed his imaging studies as well as his cytopathology report and molecular genetic testing results. TSH level was normal at 1.59. Based on his AFIRMA results, the solitary nodule on the left has a 50% risk of being malignant. I have recommended proceeding with left thyroid  lobectomy. We discussed left lobectomy versus total thyroidectomy. We discussed the risk and benefits of the procedure including the risk of recurrent laryngeal nerve injury and injury to parathyroid glands. We discussed the size and location of the surgical incision. We discussed the hospital stay to be anticipated. We discussed his postoperative recovery and return to work and activities. We discussed the potential need for lifelong thyroid  hormone supplementation. We discussed the potential need for additional surgery. We discussed the potential need for treatment with radioactive iodine. Patient and his wife understand and  agree to proceed with surgery in the near future.  Krystal Spinner, MD [facility] practice Office: [phone]

## 2023-04-18 NOTE — Anesthesia Preprocedure Evaluation (Addendum)
 Anesthesia Evaluation  Patient identified by MRN, date of birth, ID band Patient awake    Reviewed: Allergy & Precautions, NPO status , Patient's Chart, lab work & pertinent test results, reviewed documented beta blocker date and time   History of Anesthesia Complications (+) PONV and history of anesthetic complications  Airway Mallampati: II  TM Distance: >3 FB Neck ROM: Full    Dental  (+) Dental Advisory Given, Teeth Intact   Pulmonary pneumonia   Pulmonary exam normal breath sounds clear to auscultation       Cardiovascular hypertension, Pt. on home beta blockers + Valvular Problems/Murmurs  Rhythm:Regular Rate:Normal + Systolic murmurs Echo 01/2022  1. The aortic valve has been repaired/replaced. Procedure Date: 08/09/2021. Echo findings are consistent with normal structure and function of the aortic valve prosthesis. Aortic valve area, by VTI measures 3.10 cm. Aortic valve mean gradient measures 7.0 mmHg. Aortic valve Vmax measures 1.87 m/s.   2. 30 mm aortic repair graft. Stable measurements. Aortic root/ascending aorta has been repaired/replaced.   3. Left ventricular ejection fraction, by estimation, is 55 to 60%. The left ventricle has normal function. There is mild concentric left ventricular hypertrophy. Left ventricular diastolic parameters were normal.   4. Right ventricular systolic function is normal. The right ventricular size is mildly enlarged. There is normal pulmonary artery systolic pressure. The estimated right ventricular systolic pressure is 19.2 mmHg.   5. The mitral valve is grossly normal. Trivial mitral valve regurgitation. No evidence of mitral stenosis.   6. The inferior vena cava is normal in size with greater than 50% respiratory variability, suggesting right atrial pressure of 3 mmHg.     Neuro/Psych negative neurological ROS     GI/Hepatic Neg liver ROS,GERD  Controlled,,  Endo/Other  negative  endocrine ROS    Renal/GU negative Renal ROS     Musculoskeletal negative musculoskeletal ROS (+)    Abdominal  (+) + obese  Peds  Hematology negative hematology ROS (+)   Anesthesia Other Findings   Reproductive/Obstetrics                             Anesthesia Physical Anesthesia Plan  ASA: 3  Anesthesia Plan: General   Post-op Pain Management: Tylenol  PO (pre-op)* and Gabapentin  PO (pre-op)*   Induction: Intravenous  PONV Risk Score and Plan: 4 or greater and Ondansetron , Dexamethasone , Treatment may vary due to age or medical condition and Midazolam   Airway Management Planned: Oral ETT  Additional Equipment:   Intra-op Plan:   Post-operative Plan: Extubation in OR  Informed Consent: I have reviewed the patients History and Physical, chart, labs and discussed the procedure including the risks, benefits and alternatives for the proposed anesthesia with the patient or authorized representative who has indicated his/her understanding and acceptance.     Dental advisory given  Plan Discussed with: CRNA  Anesthesia Plan Comments:         Anesthesia Quick Evaluation

## 2023-04-20 ENCOUNTER — Ambulatory Visit (HOSPITAL_COMMUNITY): Payer: Managed Care, Other (non HMO) | Admitting: Physician Assistant

## 2023-04-20 ENCOUNTER — Other Ambulatory Visit: Payer: Self-pay

## 2023-04-20 ENCOUNTER — Encounter (HOSPITAL_COMMUNITY): Payer: Self-pay | Admitting: Surgery

## 2023-04-20 ENCOUNTER — Ambulatory Visit (HOSPITAL_COMMUNITY)
Admission: RE | Admit: 2023-04-20 | Discharge: 2023-04-21 | Disposition: A | Discharge status: Home / Self Care | Payer: Managed Care, Other (non HMO) | Source: Ambulatory Visit | Attending: Surgery | Admitting: Surgery

## 2023-04-20 ENCOUNTER — Encounter (HOSPITAL_COMMUNITY)
Admission: RE | Disposition: A | Discharge status: Home / Self Care | Payer: Self-pay | Source: Ambulatory Visit | Attending: Surgery

## 2023-04-20 ENCOUNTER — Ambulatory Visit (HOSPITAL_BASED_OUTPATIENT_CLINIC_OR_DEPARTMENT_OTHER): Payer: Managed Care, Other (non HMO) | Admitting: Certified Registered"

## 2023-04-20 DIAGNOSIS — Z6836 Body mass index (BMI) 36.0-36.9, adult: Secondary | ICD-10-CM | POA: Insufficient documentation

## 2023-04-20 DIAGNOSIS — I517 Cardiomegaly: Secondary | ICD-10-CM | POA: Diagnosis not present

## 2023-04-20 DIAGNOSIS — E669 Obesity, unspecified: Secondary | ICD-10-CM | POA: Diagnosis not present

## 2023-04-20 DIAGNOSIS — I10 Essential (primary) hypertension: Secondary | ICD-10-CM | POA: Insufficient documentation

## 2023-04-20 DIAGNOSIS — Z7982 Long term (current) use of aspirin: Secondary | ICD-10-CM | POA: Diagnosis not present

## 2023-04-20 DIAGNOSIS — D440 Neoplasm of uncertain behavior of thyroid gland: Secondary | ICD-10-CM

## 2023-04-20 DIAGNOSIS — C73 Malignant neoplasm of thyroid gland: Secondary | ICD-10-CM | POA: Diagnosis not present

## 2023-04-20 DIAGNOSIS — K219 Gastro-esophageal reflux disease without esophagitis: Secondary | ICD-10-CM | POA: Diagnosis not present

## 2023-04-20 DIAGNOSIS — E041 Nontoxic single thyroid nodule: Secondary | ICD-10-CM | POA: Diagnosis present

## 2023-04-20 HISTORY — PX: THYROID LOBECTOMY: SHX420

## 2023-04-20 SURGERY — LOBECTOMY, THYROID
Anesthesia: General | Laterality: Left

## 2023-04-20 MED ORDER — HYDROMORPHONE HCL 1 MG/ML IJ SOLN
0.2500 mg | INTRAMUSCULAR | Status: DC | PRN
  Administered 2023-04-20 (×2): 0.5 mg via INTRAVENOUS

## 2023-04-20 MED ORDER — LIDOCAINE HCL (PF) 2 % IJ SOLN
INTRAMUSCULAR | Status: AC
  Filled 2023-04-20: qty 5

## 2023-04-20 MED ORDER — OXYCODONE HCL 5 MG/5ML PO SOLN: 5.0000 mg | Freq: Once | ORAL | Status: DC | PRN

## 2023-04-20 MED ORDER — DEXAMETHASONE SODIUM PHOSPHATE 10 MG/ML IJ SOLN
INTRAMUSCULAR | Status: DC | PRN
  Administered 2023-04-20: 4 mg via INTRAVENOUS

## 2023-04-20 MED ORDER — PHENYLEPHRINE 80 MCG/ML (10ML) SYRINGE FOR IV PUSH (FOR BLOOD PRESSURE SUPPORT)
PREFILLED_SYRINGE | INTRAVENOUS | Status: AC
  Filled 2023-04-20: qty 10

## 2023-04-20 MED ORDER — ORAL CARE MOUTH RINSE: 15.0000 mL | Freq: Once | OROMUCOSAL | Status: AC

## 2023-04-20 MED ORDER — ACETAMINOPHEN 500 MG PO TABS
1000.0000 mg | ORAL_TABLET | Freq: Once | ORAL | Status: AC
  Administered 2023-04-20: 1000 mg via ORAL
  Filled 2023-04-20: qty 2

## 2023-04-20 MED ORDER — ONDANSETRON HCL 4 MG/2ML IJ SOLN
INTRAMUSCULAR | Status: DC | PRN
  Administered 2023-04-20: 4 mg via INTRAVENOUS

## 2023-04-20 MED ORDER — MIDAZOLAM HCL 2 MG/2ML IJ SOLN
INTRAMUSCULAR | Status: AC
  Filled 2023-04-20: qty 2

## 2023-04-20 MED ORDER — METOPROLOL SUCCINATE ER 25 MG PO TB24: 25.0000 mg | ORAL_TABLET | Freq: Every day | ORAL | Status: DC

## 2023-04-20 MED ORDER — ROCURONIUM BROMIDE 10 MG/ML (PF) SYRINGE
PREFILLED_SYRINGE | INTRAVENOUS | Status: AC
  Filled 2023-04-20: qty 10

## 2023-04-20 MED ORDER — PROPOFOL 10 MG/ML IV BOLUS
INTRAVENOUS | Status: DC | PRN
  Administered 2023-04-20: 200 mg via INTRAVENOUS

## 2023-04-20 MED ORDER — CHLORHEXIDINE GLUCONATE 0.12 % MT SOLN
15.0000 mL | Freq: Once | OROMUCOSAL | Status: AC
  Administered 2023-04-20: 15 mL via OROMUCOSAL

## 2023-04-20 MED ORDER — HYDROMORPHONE HCL 1 MG/ML IJ SOLN
INTRAMUSCULAR | Status: AC
  Administered 2023-04-20: 0.5 mg via INTRAVENOUS
  Filled 2023-04-20: qty 1

## 2023-04-20 MED ORDER — PHENYLEPHRINE 80 MCG/ML (10ML) SYRINGE FOR IV PUSH (FOR BLOOD PRESSURE SUPPORT)
PREFILLED_SYRINGE | INTRAVENOUS | Status: DC | PRN
  Administered 2023-04-20: 80 ug via INTRAVENOUS

## 2023-04-20 MED ORDER — TRAMADOL HCL 50 MG PO TABS: 50.0000 mg | ORAL_TABLET | Freq: Four times a day (QID) | ORAL | Status: DC | PRN

## 2023-04-20 MED ORDER — GABAPENTIN 300 MG PO CAPS
300.0000 mg | ORAL_CAPSULE | Freq: Once | ORAL | Status: AC
  Administered 2023-04-20: 300 mg via ORAL
  Filled 2023-04-20: qty 1

## 2023-04-20 MED ORDER — SUGAMMADEX SODIUM 200 MG/2ML IV SOLN
INTRAVENOUS | Status: DC | PRN
  Administered 2023-04-20: 250 mg via INTRAVENOUS

## 2023-04-20 MED ORDER — SODIUM CHLORIDE 0.9 % IV SOLN
3.0000 g | INTRAVENOUS | Status: AC
  Administered 2023-04-20: 2 g via INTRAVENOUS
  Filled 2023-04-20: qty 3

## 2023-04-20 MED ORDER — DROPERIDOL 2.5 MG/ML IJ SOLN: 0.6250 mg | Freq: Once | INTRAMUSCULAR | Status: DC | PRN

## 2023-04-20 MED ORDER — STERILE WATER FOR IRRIGATION IR SOLN
Status: DC | PRN
  Administered 2023-04-20: 1000 mL

## 2023-04-20 MED ORDER — ONDANSETRON HCL 4 MG/2ML IJ SOLN
INTRAMUSCULAR | Status: AC
  Filled 2023-04-20: qty 2

## 2023-04-20 MED ORDER — ACETAMINOPHEN 325 MG PO TABS: 650.0000 mg | ORAL_TABLET | Freq: Four times a day (QID) | ORAL | Status: DC | PRN

## 2023-04-20 MED ORDER — ACETAMINOPHEN 650 MG RE SUPP: 650.0000 mg | Freq: Four times a day (QID) | RECTAL | Status: DC | PRN

## 2023-04-20 MED ORDER — PROPOFOL 10 MG/ML IV BOLUS
INTRAVENOUS | Status: AC
  Filled 2023-04-20: qty 20

## 2023-04-20 MED ORDER — FENTANYL CITRATE (PF) 100 MCG/2ML IJ SOLN
INTRAMUSCULAR | Status: DC | PRN
  Administered 2023-04-20: 25 ug via INTRAVENOUS
  Administered 2023-04-20: 50 ug via INTRAVENOUS
  Administered 2023-04-20: 25 ug via INTRAVENOUS
  Administered 2023-04-20: 100 ug via INTRAVENOUS

## 2023-04-20 MED ORDER — 0.9 % SODIUM CHLORIDE (POUR BTL) OPTIME
TOPICAL | Status: DC | PRN
  Administered 2023-04-20: 1000 mL

## 2023-04-20 MED ORDER — SODIUM CHLORIDE 0.45 % IV SOLN: INTRAVENOUS | Status: DC

## 2023-04-20 MED ORDER — ROCURONIUM BROMIDE 10 MG/ML (PF) SYRINGE
PREFILLED_SYRINGE | INTRAVENOUS | Status: DC | PRN
  Administered 2023-04-20: 10 mg via INTRAVENOUS
  Administered 2023-04-20: 60 mg via INTRAVENOUS

## 2023-04-20 MED ORDER — FENTANYL CITRATE (PF) 100 MCG/2ML IJ SOLN
INTRAMUSCULAR | Status: AC
  Filled 2023-04-20: qty 2

## 2023-04-20 MED ORDER — OXYCODONE HCL 5 MG PO TABS: 5.0000 mg | ORAL_TABLET | ORAL | Status: DC | PRN

## 2023-04-20 MED ORDER — HYDROMORPHONE HCL 1 MG/ML IJ SOLN: 1.0000 mg | INTRAMUSCULAR | Status: DC | PRN

## 2023-04-20 MED ORDER — OXYCODONE HCL 5 MG PO TABS: 5.0000 mg | ORAL_TABLET | Freq: Once | ORAL | Status: DC | PRN

## 2023-04-20 MED ORDER — HEMOSTATIC AGENTS (NO CHARGE) OPTIME
TOPICAL | Status: DC | PRN
  Administered 2023-04-20: 1 via TOPICAL

## 2023-04-20 MED ORDER — ONDANSETRON 4 MG PO TBDP: 4.0000 mg | ORAL_TABLET | Freq: Four times a day (QID) | ORAL | Status: DC | PRN

## 2023-04-20 MED ORDER — LACTATED RINGERS IV SOLN: INTRAVENOUS | Status: DC

## 2023-04-20 MED ORDER — DEXAMETHASONE SODIUM PHOSPHATE 10 MG/ML IJ SOLN
INTRAMUSCULAR | Status: AC
  Filled 2023-04-20: qty 1

## 2023-04-20 MED ORDER — MIDAZOLAM HCL 2 MG/2ML IJ SOLN
INTRAMUSCULAR | Status: DC | PRN
  Administered 2023-04-20: 2 mg via INTRAVENOUS

## 2023-04-20 MED ORDER — LIDOCAINE 2% (20 MG/ML) 5 ML SYRINGE
INTRAMUSCULAR | Status: DC | PRN
  Administered 2023-04-20: 100 mg via INTRAVENOUS

## 2023-04-20 MED ORDER — CHLORHEXIDINE GLUCONATE CLOTH 2 % EX PADS: 6.0000 | MEDICATED_PAD | Freq: Once | CUTANEOUS | Status: DC

## 2023-04-20 MED ORDER — ONDANSETRON HCL 4 MG/2ML IJ SOLN
4.0000 mg | Freq: Four times a day (QID) | INTRAMUSCULAR | Status: DC | PRN
  Administered 2023-04-20 (×2): 4 mg via INTRAVENOUS
  Filled 2023-04-20 (×2): qty 2

## 2023-04-20 SURGICAL SUPPLY — 28 items
ATTRACTOMAT 16X20 MAGNETIC DRP (DRAPES) ×1 IMPLANT
BAG COUNTER SPONGE SURGICOUNT (BAG) ×1 IMPLANT
BLADE SURG 15 STRL LF DISP TIS (BLADE) ×1 IMPLANT
CHLORAPREP W/TINT 26 (MISCELLANEOUS) ×1 IMPLANT
CLIP TI MEDIUM 6 (CLIP) ×2 IMPLANT
CLIP TI WIDE RED SMALL 6 (CLIP) ×2 IMPLANT
CLIP VESOCCLUDE SM WIDE 6/CT (CLIP) IMPLANT
COVER SURGICAL LIGHT HANDLE (MISCELLANEOUS) ×1 IMPLANT
DERMABOND ADVANCED .7 DNX12 (GAUZE/BANDAGES/DRESSINGS) ×1 IMPLANT
DRAPE LAPAROTOMY T 98X78 PEDS (DRAPES) ×1 IMPLANT
DRAPE UTILITY XL STRL (DRAPES) ×1 IMPLANT
ELECT PENCIL ROCKER SW 15FT (MISCELLANEOUS) ×1 IMPLANT
ELECT REM PT RETURN 15FT ADLT (MISCELLANEOUS) ×1 IMPLANT
GAUZE 4X4 16PLY ~~LOC~~+RFID DBL (SPONGE) ×1 IMPLANT
GLOVE SURG ORTHO 8.0 STRL STRW (GLOVE) ×1 IMPLANT
GOWN STRL REUS W/ TWL XL LVL3 (GOWN DISPOSABLE) ×2 IMPLANT
HEMOSTAT SURGICEL 2X4 FIBR (HEMOSTASIS) ×1 IMPLANT
ILLUMINATOR WAVEGUIDE N/F (MISCELLANEOUS) ×1 IMPLANT
KIT BASIN OR (CUSTOM PROCEDURE TRAY) ×1 IMPLANT
KIT TURNOVER KIT A (KITS) IMPLANT
PACK BASIC VI WITH GOWN DISP (CUSTOM PROCEDURE TRAY) ×1 IMPLANT
SHEARS HARMONIC 9CM CVD (BLADE) ×1 IMPLANT
SUT MNCRL AB 4-0 PS2 18 (SUTURE) ×1 IMPLANT
SUT SILK 3 0 SH 30 (SUTURE) ×1 IMPLANT
SUT VIC AB 3-0 SH 18 (SUTURE) ×2 IMPLANT
SYR BULB IRRIG 60ML STRL (SYRINGE) ×1 IMPLANT
TOWEL OR 17X26 10 PK STRL BLUE (TOWEL DISPOSABLE) ×1 IMPLANT
TUBING CONNECTING 10 (TUBING) ×1 IMPLANT

## 2023-04-20 NOTE — Op Note (Signed)
 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 Linder Revere for surgical evaluation and management of a newly diagnosed thyroid  neoplasm of uncertain behavior. Since primary care provider is Beth Brooke, [place]. Patient had undergone cardiac surgical procedure in 2003. As part of his follow-up, he underwent a CT scan this past summer in July 2023. Incidental finding was made of a left thyroid  nodule. Patient underwent ultrasound examination on September 29, 2022. This showed a 3.4 x 2.6 x 2.2 cm nodule which was felt to be mildly suspicious and fine-needle aspiration biopsy was recommended. Patient underwent biopsy on December 05, 2022. 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 Specimen was sent for [facility] testing. Molecular genetic results returned with a result of suspicious, rendering a risk of malignancy of 50%. There were no DNA mutations. Patient is now referred for evaluation for surgical resection for definitive diagnosis and management.   Procedure Details: Procedure was done in OR #1 at the [facility]. The patient was brought to the operating room and placed in a supine position on the operating room table. Following administration of general anesthesia, the patient was positioned and then prepped and draped in the usual aseptic fashion. After ascertaining that an adequate level of anesthesia had been achieved, a small Kocher incision was made with #15 blade. Dissection was carried through subcutaneous tissues and platysma. Hemostasis was achieved with the electrocautery. Skin flaps were elevated cephalad and  caudad from the thyroid  notch to the sternal notch. A self-retaining retractor was placed for exposure. Strap muscles were incised in the midline and dissection was begun on the left side. Strap muscles were reflected laterally. The left thyroid  lobe was minimally enlarged with a dominant soft nodule located centrally. The lobe was gently mobilized with blunt dissection. Superior pole vessels were dissected out and divided individually between small and medium ligaclips with the harmonic scalpel. The thyroid  lobe was rolled anteriorly. Branches of the inferior thyroid  artery were divided between small ligaclips with the harmonic scalpel. Inferior venous tributaries were divided between ligaclips. Both the superior and inferior parathyroid glands were identified and preserved on their vascular pedicles. The recurrent laryngeal nerve was identified and preserved along its course. The ligament of Katheryne Pane was released with the electrocautery and the gland was mobilized onto the anterior trachea. Isthmus was mobilized across the midline. There was no significant pyramidal lobe present. The thyroid  parenchyma was transected at the junction of the isthmus and contralateral thyroid  lobe with the harmonic scalpel. A suture was used to mark the isthmus margin. The thyroid  lobe and isthmus were submitted to pathology for review.  The entire field was palpated for evidence of lymphadenopathy or extra-thyroidal disease.  No worrisome findings were noted.  No enlarged lymph nodes were identified.  The neck was irrigated with warm saline. Fibrillar was placed throughout the operative field. Strap muscles were approximated in the midline with interrupted 3-0 Vicryl sutures. Platysma was closed with interrupted 3-0 Vicryl sutures. Skin was closed with a running 4-0 Monocryl subcuticular suture.  Wound was washed and dried and Dermabond was applied. The patient was awakened from  anesthesia and brought to the recovery room. The  patient tolerated the procedure well.   Oralee Billow, MD [facility] Surgery Office: [phone]

## 2023-04-20 NOTE — Anesthesia Postprocedure Evaluation (Signed)
 Anesthesia Post Note  Patient: Johnny Henderson  Procedure(s) Performed: LEFT THYROID  LOBECTOMY (Left)     Patient location during evaluation: PACU Anesthesia Type: General Level of consciousness: sedated and patient cooperative Pain management: pain level controlled Vital Signs Assessment: post-procedure vital signs reviewed and stable Respiratory status: spontaneous breathing Cardiovascular status: stable Anesthetic complications: no   No notable events documented.  Last Vitals:  Vitals:   04/20/23 1015 04/20/23 1030  BP: (!) 148/89 (!) 141/87  Pulse: 71 70  Resp: 12 13  Temp:    SpO2: 92% 93%    Last Pain:  Vitals:   04/20/23 1015  TempSrc:   PainSc: Asleep                 Gorman Laughter

## 2023-04-20 NOTE — Interval H&P Note (Signed)
 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  The various methods of treatment have been discussed with the patient and family. After consideration of risks, benefits and other options for treatment, the patient has consented to    Procedure(s): LEFT THYROID  LOBECTOMY (Left) as a surgical intervention.    The patient's history has been reviewed, patient examined, no change in status, stable for surgery.  I have reviewed the patient's chart and labs.  Questions were answered to the patient's satisfaction.    Oralee Billow, MD [facility] Surgery [facility] practice Office: [phone]   Oralee Billow

## 2023-04-20 NOTE — Anesthesia Procedure Notes (Signed)
 Procedure Name: Intubation Date/Time: 04/20/2023 7:43 AM  Performed by: Alwyn Juba, CRNAPre-anesthesia Checklist: Patient identified, Emergency Drugs available, Suction available, Patient being monitored and Timeout performed Patient Re-evaluated:Patient Re-evaluated prior to induction Oxygen Delivery Method: Circle system utilized Preoxygenation: Pre-oxygenation with 100% oxygen Induction Type: IV induction Ventilation: Mask ventilation without difficulty Laryngoscope Size: Mac and 4 Grade View: Grade II Tube type: Oral Tube size: 7.5 mm Number of attempts: 1 Airway Equipment and Method: Stylet Placement Confirmation: ETT inserted through vocal cords under direct vision, positive ETCO2 and breath sounds checked- equal and bilateral Secured at: 23 cm Tube secured with: Tape Dental Injury: Teeth and Oropharynx as per pre-operative assessment

## 2023-04-20 NOTE — Transfer of Care (Signed)
 Immediate Anesthesia Transfer of Care Note  Patient: Johnny Henderson  Procedure(s) Performed: LEFT THYROID  LOBECTOMY (Left)  Patient Location: PACU  Anesthesia Type:General  Level of Consciousness: awake, drowsy, and patient cooperative  Airway & Oxygen Therapy: Patient Spontanous Breathing and Patient connected to face mask oxygen  Post-op Assessment: Report given to RN and Post -op Vital signs reviewed and stable  Post vital signs: Reviewed and stable  Last Vitals:  Vitals Value Taken Time  BP 158/104 04/20/23 0924  Temp    Pulse 84 04/20/23 0925  Resp 13 04/20/23 0925  SpO2 94 % 04/20/23 0925  Vitals shown include unfiled device data.  Last Pain:  Vitals:   04/20/23 0555  TempSrc: Oral         Complications: No notable events documented.

## 2023-04-21 ENCOUNTER — Encounter (HOSPITAL_COMMUNITY): Payer: Self-pay | Admitting: Surgery

## 2023-04-21 DIAGNOSIS — C73 Malignant neoplasm of thyroid gland: Secondary | ICD-10-CM | POA: Diagnosis not present

## 2023-04-21 NOTE — Discharge Instructions (Signed)

## 2023-04-21 NOTE — Progress Notes (Signed)
Reviewed written d/c instructions with pt and all questions answered. Pt verbalized understanding. Pt left in stable condition with all belongings.

## 2023-04-21 NOTE — Plan of Care (Signed)
  Problem: Activity: Goal: Risk for activity intolerance will decrease Outcome: Adequate for Discharge   Problem: Nutrition: Goal: Adequate nutrition will be maintained Outcome: Progressing   Problem: Elimination: Goal: Will not experience complications related to urinary retention Outcome: Adequate for Discharge   Problem: Pain Managment: Goal: General experience of comfort will improve and/or be controlled Outcome: Progressing

## 2023-04-21 NOTE — Discharge Summary (Signed)
    Physician Discharge Summary   Patient ID: Johnny Henderson MRN: [id number] DOB/AGE: 05-12-1970 53 y.o.  Admit date: 04/20/2023  Discharge date: 04/21/2023  Discharge Diagnoses:  Principal Problem:   Neoplasm of uncertain behavior of thyroid gland Active Problems:   Thyroid nodule, uninodular   Discharged Condition: good  Hospital Course: Patient was admitted for observation following thyroid surgery.  Post op course was uncomplicated.  Pain was well controlled.  Tolerated diet.  Patient was prepared for discharge home on POD#1.  Consults: None  Treatments: surgery: left thyroid lobectomy  Discharge Exam: Blood pressure 135/78, pulse 79, temperature 97.9 F (36.6 C), temperature source Oral, resp. rate 18, height 6' (1.829 m), weight 122.5 kg, SpO2 97%. HEENT - clear Neck - wound dry and intact; mild STS; voice normal; Dermabond in place  Disposition: Home  Discharge Instructions     Diet - low sodium heart healthy   Complete by: As directed    Increase activity slowly   Complete by: As directed    No dressing needed   Complete by: As directed       Allergies as of 04/21/2023       Reactions   Bee Venom Anaphylaxis   Other Anaphylaxis   Wasps Yellow Jackets         Medication List     TAKE these medications    aspirin EC 81 MG tablet Take 1 tablet (81 mg total) by mouth daily. Swallow whole. What changed: when to take this   metoprolol succinate 25 MG 24 hr tablet Commonly known as: Toprol XL Take 1 tablet (25 mg total) by mouth daily.   testosterone cypionate 200 MG/ML injection Commonly known as: DEPOTESTOSTERONE CYPIONATE Inject 200 mg into the muscle every 14 (fourteen) days.               Discharge Care Instructions  (From admission, onward)           Start     Ordered   04/21/23 0000  No dressing needed        04/21/23 4098            Follow-up Information     Darnell Level, MD. Schedule an appointment as soon as  possible for a visit in 3 week(s).   Specialty: General Surgery Why: For wound re-check Contact information: [address] [place] [address] [phone]                 Darnell Level, MD [facility] Office: [phone]   Signed: Darnell Level 04/21/2023, 6:44 AM

## 2023-04-23 ENCOUNTER — Encounter: Payer: Self-pay | Admitting: Surgery

## 2023-04-23 LAB — SURGICAL PATHOLOGY

## 2023-04-23 NOTE — Progress Notes (Signed)
Pathology called to patient.  Margins negative.  Likely lobectomy will be adequate treatment.  Will arrange endocrine consultation.  Darnell Level, MD [facility] Surgery [facility] practice Office: [phone]

## 2023-05-05 IMAGING — CT CT ANGIO CHEST
2 of 8 series · 16 of 46 positions shown · IV contrast (OMNIPAQUE 350)
Comparison: None.

CLINICAL DATA: History of bicuspid aortic valve with moderate
aortic stenosis and dilated thoracic aorta seen by outside
echocardiography.

EXAM:
CT ANGIOGRAPHY CHEST WITH CONTRAST
TECHNIQUE: Multidetector CT imaging of the chest was performed using the
standard protocol during bolus administration of intravenous
contrast. Multiplanar CT image reconstructions and MIPs were
obtained to evaluate the vascular anatomy.

[Series 4: aorta 3.0 bf37 2 · axial · 0.78mm/px · z∈[-318,-68]mm · 13 of 97 slices shown]
[im 7/97  lung]
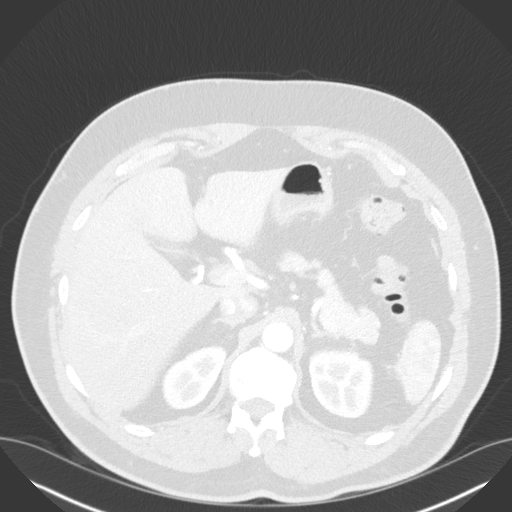
[im 14/97  soft-tissue]
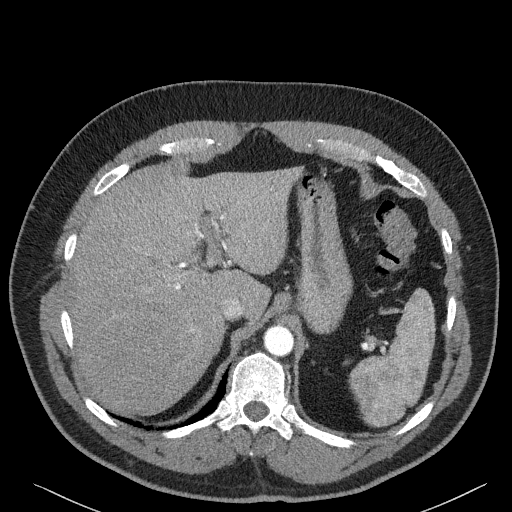
[im 21/97  lung]
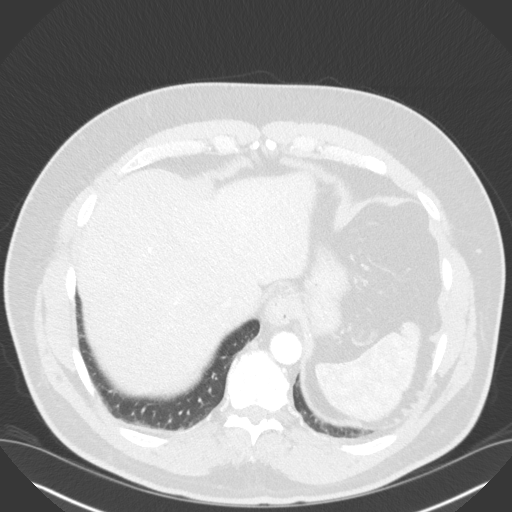
[im 28/97  soft-tissue]
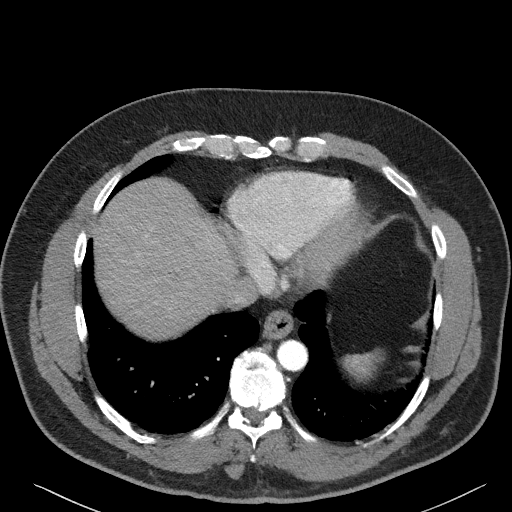
[im 35/97  lung]
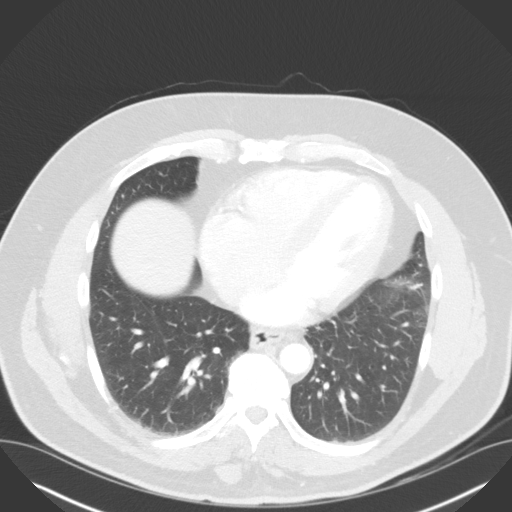
[im 42/97  soft-tissue]
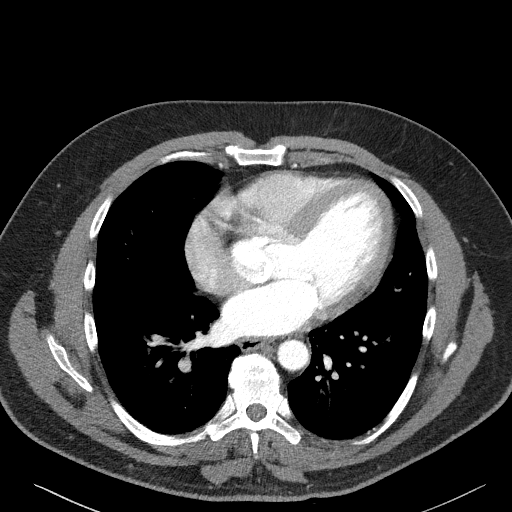
[im 49/97  lung]
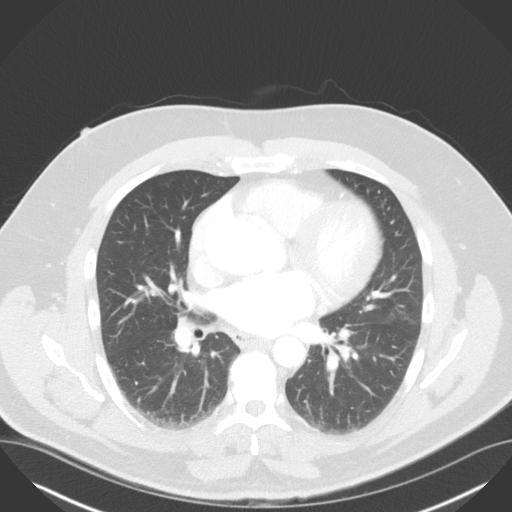
[im 55/97  soft-tissue]
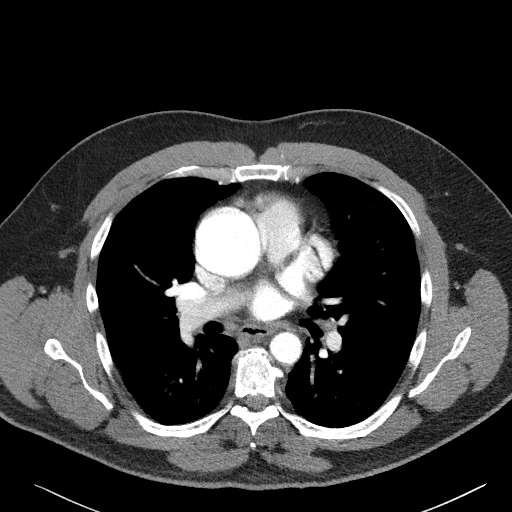
[im 62/97  lung]
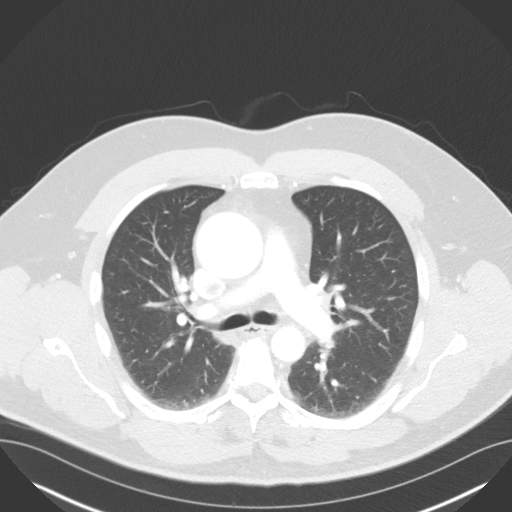
[im 69/97  soft-tissue]
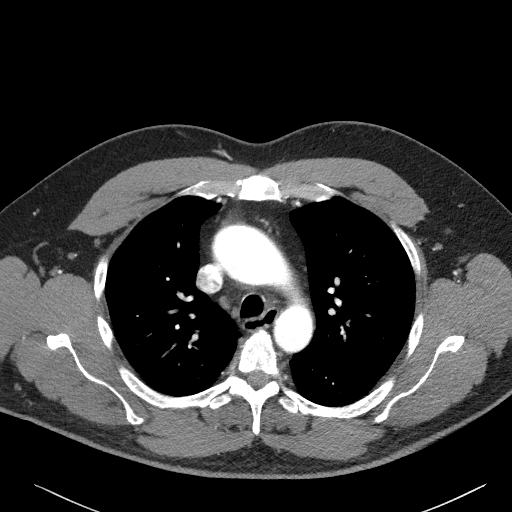
[im 76/97  lung]
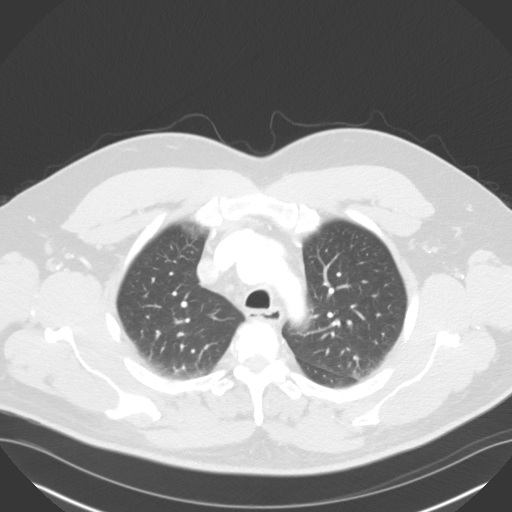
[im 83/97  soft-tissue]
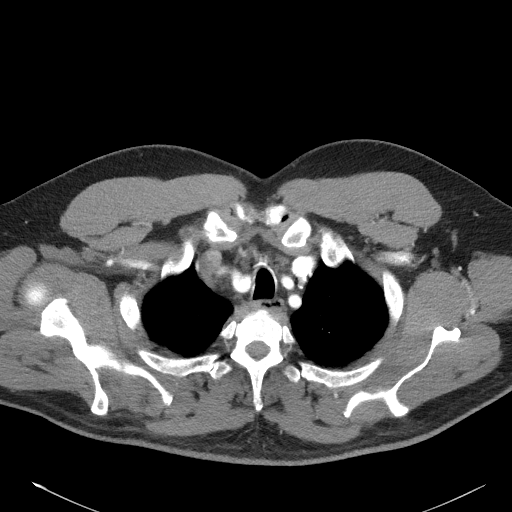
[im 90/97  lung]
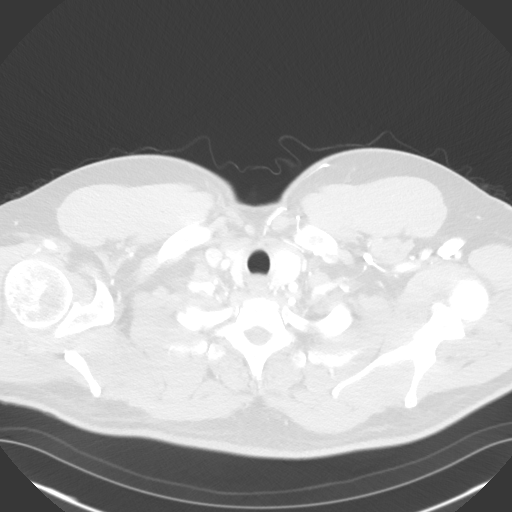

[Series 7: coronals · coronal · 0.58mm/px · 3 of 135 slices shown]
[im 34/135  soft-tissue]
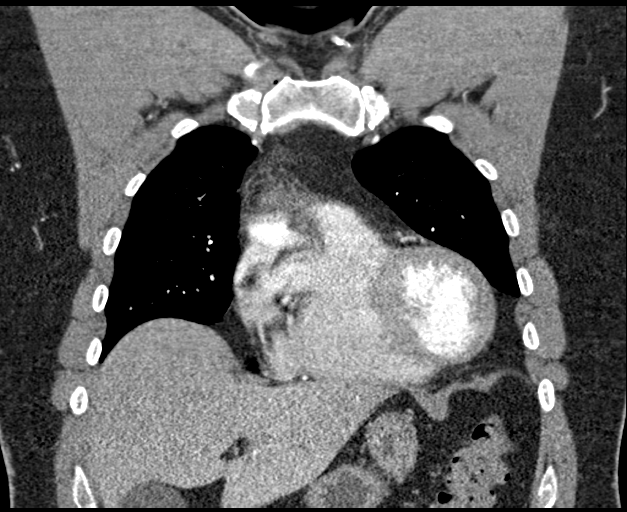
[im 68/135  soft-tissue]
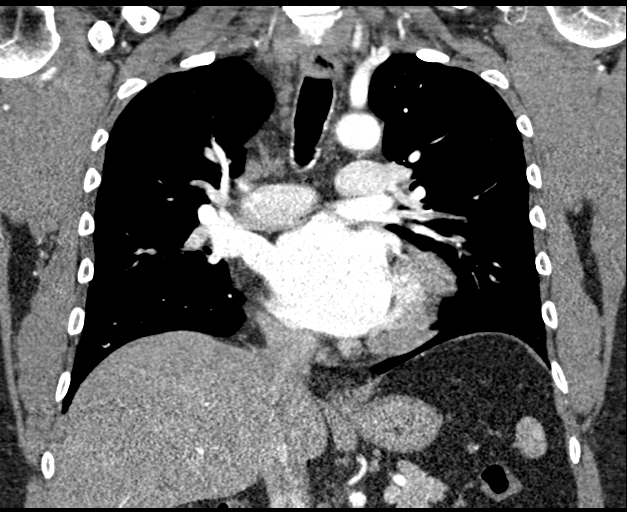
[im 101/135  soft-tissue]
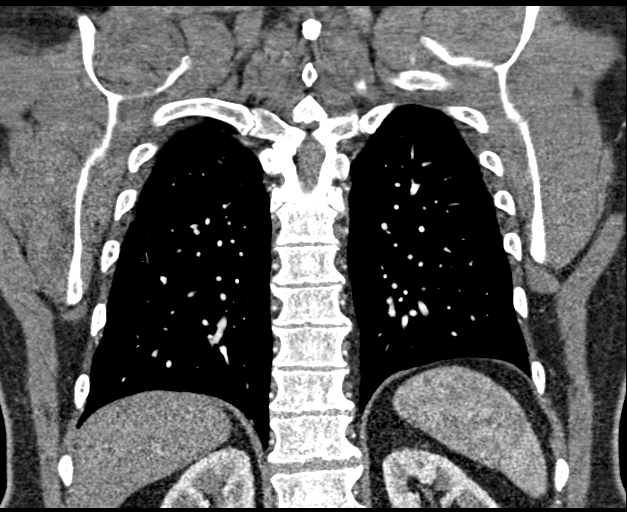

[16 of 46 positions shown; findings below may reference images not displayed]

RADIATION DOSE REDUCTION: This exam was performed according to the
departmental dose-optimization program which includes automated
exposure control, adjustment of the mA and/or kV according to
patient size and/or use of iterative reconstruction technique.

CONTRAST:  100mL OMNIPAQUE IOHEXOL 350 MG/ML SOLN
FINDINGS: Cardiovascular: The aortic root measures approximately 4.0-4.2 cm at
the level of the sinuses of Valsalva. The aortic valve is thickened
and calcified. The ascending thoracic aorta is difficult to
accurately measure due to pulsation artifact but is clearly
significantly dilated with maximal diameter estimated to be
approximately 5.2 cm. The proximal arch measures 4 cm and the distal
arch 2.5 cm. The descending thoracic aorta measures 2.6 cm.
Visualized proximal great vessels demonstrate normal patency and
normal branching anatomy. No evidence of aortic dissection.

Top-normal heart size. No pericardial fluid. No significant
visualized calcified coronary artery plaque. Central pulmonary
arteries are normal in caliber.

Mediastinum/Nodes: No enlarged mediastinal, hilar, or axillary lymph
nodes. Thyroid gland, trachea, and esophagus demonstrate no
significant findings.

Lungs/Pleura: There is no evidence of pulmonary edema,
consolidation, pneumothorax, nodule or pleural fluid.

Upper Abdomen: No acute abnormality.

Musculoskeletal: No chest wall abnormality. No acute or significant
osseous findings.

Review of the MIP images confirms the above findings.
IMPRESSION: Degenerated aortic valve with significant associated aneurysmal
disease of the ascending thoracic aorta measuring up to
approximately 5.2 cm in greatest diameter. Aneurysmal dilatation
extends into the proximal arch which measures approximately 4 cm.
Ascending thoracic aortic aneurysm. Recommend semi-annual imaging
followup by CTA or MRA and referral to cardiothoracic surgery if not
already obtained. This recommendation follows 2979
ACCF/AHA/AATS/ACR/ASA/SCA/BOER/SEXA/KIMBERLI/PASOPATI Guidelines for the
Diagnosis and Management of Patients With Thoracic Aortic Disease.
Circulation. 2979; 121: E266-e369. Aortic aneurysm NOS (MTB0Q-IZA.Q)

Aortic aneurysm NOS (MTB0Q-IZA.Q).

## 2023-07-20 ENCOUNTER — Telehealth: Payer: Self-pay

## 2023-07-20 ENCOUNTER — Ambulatory Visit: Attending: Student | Admitting: Student

## 2023-07-20 ENCOUNTER — Telehealth: Payer: Self-pay | Admitting: *Deleted

## 2023-07-20 DIAGNOSIS — Z01810 Encounter for preprocedural cardiovascular examination: Secondary | ICD-10-CM | POA: Diagnosis not present

## 2023-07-20 NOTE — Telephone Encounter (Signed)
   Pre-operative Risk Assessment    Patient Name: Johnny Henderson  DOB: [birth year] MRN: [id number]   Date of last office visit: 03/16/23 Antoinette Batman, MD Date of next office visit: NONE   Request for Surgical Clearance    Procedure:  RIGHT PERONEUS LONGUS TENDON DEBRIDEMENT V. REPAIR; RIGHT PERONEUS BREVIS TENDON TENOLYSIS; POSSIBLE CALCANEUS EXOSTECTOMY  Date of Surgery:  Clearance 07/21/23                                Surgeon:  DR Autry Legions HEWITT Surgeon's Group or Practice Name:  [facility] Phone number:  [phone] Fax number:  [phone]  ATTN: MEGAN DAVIS   Type of Clearance Requested:   - Medical  - Pharmacy:  Hold Aspirin      Type of Anesthesia:  Not Indicated   Additional requests/questions:    SignedCollin Deal   07/20/2023, 11:02 AM

## 2023-07-20 NOTE — Telephone Encounter (Signed)
 S/w the pt that he needs a tele appt today as we just received this request today from Dr. Edwena Graham office for procedure 07/21/23. I reviewed recommendations for ASA per cardiology to remain on ASA however if surgeon needs to hold then ok hold ASA 5-7 days prior. I asked the pt when he did last take his ASA. He said a few days ago but then says he thinks he took one yesterday but not sure.   Med rec and consent are done.

## 2023-07-20 NOTE — Progress Notes (Signed)
 Virtual Visit via Telephone Note   Because of Johnny Henderson co-morbid illnesses, he is at least at moderate risk for complications without adequate follow up.  This format is felt to be most appropriate for this patient at this time.  The patient did not have access to video technology/had technical difficulties with video requiring transitioning to audio format only (telephone).  All issues noted in this document were discussed and addressed.  No physical exam could be performed with this format.  Please refer to the patient's chart for his consent to telehealth for [facility].  Evaluation Performed:  Preoperative cardiovascular risk assessment _____________   Date:  07/20/2023   Patient ID:  Johnny Henderson, DOB 01-04-1971, MRN [id number] Patient Location:  Home Provider location:   Office  Primary Care Provider:  Bentley Bray, [place] Primary Cardiologist:  Antoinette Batman, MD  Chief Complaint / Patient Profile   53 y.o. y/o male with a h/o aortic stenosis s/p AVR June 2023, aneurysm of ascending aorta s/p repair June 2023, hypertension, thyroid  nodule s/p left thyroid  lobectomy February 2025 who is pending right peroneus longus tendon debridement versus repair, right peroneus brevis tendon tenolysis, possible calcaneus exostectomy by Dr. Rosebud Confer on 07/21/2023 and presents today for telephonic preoperative cardiovascular risk assessment.  History of Present Illness    Johnny Henderson is a 53 y.o. male who presents via audio/video conferencing for a telehealth visit today.  Pt was last seen in cardiology clinic on 03/16/2023 by Dr. Abel Hoe.  At that time Johnny Henderson was stable from a cardiac standpoint.  The patient is now pending procedure as outlined above. Since his last visit, he is doing well. Patient denies shortness of breath, dyspnea on exertion, lower extremity edema, orthopnea or PND. No chest pain, pressure, or tightness. No palpitations.  Prior to injuring his ankle  he was performing cardio and weight training a few times a week. He is currently coaching soccer.   Past Medical History    Past Medical History:  Diagnosis Date   Aortic stenosis    diagnosed at age 53   Heart murmur    History of chicken pox    Hypertension    Hypogonadism male    Low serum testosterone    Non-recurrent bilateral inguinal hernia    Pneumonia    PONV (postoperative nausea and vomiting)    Past Surgical History:  Procedure Laterality Date   BENTALL PROCEDURE N/A 08/09/2021   Procedure: BENTALL PROCEDURE USING Konect RESILIA  AORTIC VALVED CONDUIT;  Surgeon: Bartley Lightning, MD;  Location: [facility] OR;  Service: Open Heart Surgery;  Laterality: N/A;  CIRC ARREST   KNEE SURGERY Right    age 3s. x2   LEFT HEART CATH AND CORONARY ANGIOGRAPHY N/A 07/26/2021   Procedure: LEFT HEART CATH AND CORONARY ANGIOGRAPHY;  Surgeon: Odie Benne, MD;  Location: [facility] INVASIVE CV LAB;  Service: Cardiovascular;  Laterality: N/A;   TEE WITHOUT CARDIOVERSION N/A 08/09/2021   Procedure: TRANSESOPHAGEAL ECHOCARDIOGRAM (TEE);  Surgeon: Bartley Lightning, MD;  Location[facility];  Service: Open Heart Surgery;  Laterality: N/A;   THORACIC AORTIC ANEURYSM REPAIR N/A 08/09/2021   Procedure: REPLACEMENT OF ASCENDING AORTIC ANEURYSM USING HEMASHIELD PLATINUM VASCULAR GRAFT;  Surgeon: Bartley Lightning, MD;  Location: [facility] OR;  Service: Open Heart Surgery;  Laterality: N/A;   THYROID  LOBECTOMY Left 04/20/2023   Procedure: LEFT THYROID  LOBECTOMY;  Surgeon: Oralee Billow, MD;  Location: [facility] ORS;  Service: General;  Laterality: Left;    Allergies  Allergies  Allergen Reactions   Bee Venom Anaphylaxis   Other Anaphylaxis    Wasps Yellow Jackets     Home Medications    Prior to Admission medications   Medication Sig Start Date End Date Taking? Authorizing Provider  aspirin  EC 81 MG tablet Take 1 tablet (81 mg total) by mouth daily. Swallow whole. Patient taking differently: Take 81 mg by mouth  at bedtime. Swallow whole. 08/15/21   Barrett, Malachy Scripture, PA-C  levothyroxine (SYNTHROID) 25 MCG tablet Take 25 mcg by mouth daily. 07/12/23   [provider]  metoprolol  succinate (TOPROL  XL) 25 MG 24 hr tablet Take 1 tablet (25 mg total) by mouth daily. 03/16/23   Odie Benne, MD  testosterone cypionate (DEPOTESTOSTERONE CYPIONATE) 200 MG/ML injection Inject 200 mg into the muscle every 14 (fourteen) days. 01/05/23   [provider]    Physical Exam    Vital Signs:  Johnny Henderson does not have vital signs available for review today.  Given telephonic nature of communication, physical exam is limited. AAOx3. NAD. Normal affect.  Speech and respirations are unlabored.  Assessment & Plan    Primary Cardiologist: Antoinette Batman, MD  Preoperative cardiovascular risk assessment.  Right peroneus longus tendon debridement versus repair, right peroneus brevis tendon tenolysis, possible calcaneus exostectomy by Dr. Rosebud Confer on 07/21/2023.  Chart reviewed as part of pre-operative protocol coverage. According to the RCRI, patient has a 0.9% risk of Henderson. Patient reports activity equivalent to 4.0 METS (prior to injury was working out performing cardio and weight training a few times a week).   Given past medical history and time since last visit, based on ACC/AHA guidelines, Johnny Henderson would be at acceptable risk for the planned procedure without further cardiovascular testing.   Patient was advised that if he develops new symptoms prior to surgery to contact our office to arrange a follow-up appointment.  he verbalized understanding.  Ideally aspirin  should be continued without interruption, however if the bleeding risk is too great, aspirin  may be held for 5-7 days prior to surgery. Please resume aspirin  post operatively when it is felt to be safe from a bleeding standpoint.    I will route this recommendation to the requesting party via Epic fax function.  Please call  with questions.  Time:   Today, I have spent 5 minutes with the patient with telehealth technology discussing medical history, symptoms, and management plan.     Morey Ar, NP  07/20/2023, 11:25 AM

## 2023-07-20 NOTE — Telephone Encounter (Signed)
 S/w the pt that he needs a tele appt today as we just received this request today from Dr. Edwena Graham office for procedure 07/21/23. I reviewed recommendations for ASA per cardiology to remain on ASA however if surgeon needs to hold then ok hold ASA 5-7 days prior. I asked the pt when he did last take his ASA. He said a few days ago but then says he thinks he took one yesterday but not sure.   Med rec and consent are done.       Patient Consent for Virtual Visit        Johnny Henderson has provided verbal consent on 07/20/2023 for a virtual visit (video or telephone).   CONSENT FOR VIRTUAL VISIT FOR:  Johnny Henderson  By participating in this virtual visit I agree to the following:  I hereby voluntarily request, consent and authorize [facility] and its employed or contracted physicians, physician assistants, nurse practitioners or other licensed health care professionals (the Practitioner), to provide me with telemedicine health care services (the "Services") as deemed necessary by the treating Practitioner. I acknowledge and consent to receive the Services by the Practitioner via telemedicine. I understand that the telemedicine visit will involve communicating with the Practitioner through live audiovisual communication technology and the disclosure of certain medical information by electronic transmission. I acknowledge that I have been given the opportunity to request an in-person assessment or other available alternative prior to the telemedicine visit and am voluntarily participating in the telemedicine visit.  I understand that I have the right to withhold or withdraw my consent to the use of telemedicine in the course of my care at any time, without affecting my right to future care or treatment, and that the Practitioner or I may terminate the telemedicine visit at any time. I understand that I have the right to inspect all information obtained and/or recorded in the course of the  telemedicine visit and may receive copies of available information for a reasonable fee.  I understand that some of the potential risks of receiving the Services via telemedicine include:  Delay or interruption in medical evaluation due to technological equipment failure or disruption; Information transmitted may not be sufficient (e.g. poor resolution of images) to allow for appropriate medical decision making by the Practitioner; and/or  In rare instances, security protocols could fail, causing a breach of personal health information.  Furthermore, I acknowledge that it is my responsibility to provide information about my medical history, conditions and care that is complete and accurate to the best of my ability. I acknowledge that Practitioner's advice, recommendations, and/or decision may be based on factors not within their control, such as incomplete or inaccurate data provided by me or distortions of diagnostic images or specimens that may result from electronic transmissions. I understand that the practice of medicine is not an exact science and that Practitioner makes no warranties or guarantees regarding treatment outcomes. I acknowledge that a copy of this consent can be made available to me via my patient portal [facility] MyChart), or I can request a printed copy by calling the office of [facility].    I understand that my insurance will be billed for this visit.   I have read or had this consent read to me. I understand the contents of this consent, which adequately explains the benefits and risks of the Services being provided via telemedicine.  I have been provided ample opportunity to ask questions regarding this consent and the  Services and have had my questions answered to my satisfaction. I give my informed consent for the services to be provided through the use of telemedicine in my medical care

## 2023-07-20 NOTE — Telephone Encounter (Signed)
   Name: Johnny Henderson  DOB: March 01, 1971  MRN: [id number]  Primary Cardiologist: Antoinette Batman, MD   Preoperative team, please contact this patient and set up a phone call appointment for further preoperative risk assessment. Please obtain consent and complete medication review. Thank you for your help.  I confirm that guidance regarding antiplatelet and oral anticoagulation therapy has been completed and, if necessary, noted below.  Ideally aspirin  should be continued without interruption, however if the bleeding risk is too great, aspirin  may be held for 5-7 days prior to surgery. Please resume aspirin  post operatively when it is felt to be safe from a bleeding standpoint.    I also confirmed the patient resides in the state of [place] . As per [facility] Medical Board telemedicine laws, the patient must reside in the state in which the provider is licensed.   Morey Ar, NP 07/20/2023, 11:08 AM [facility]
